# Patient Record
Sex: Female | Born: 1965 | Race: White | Hispanic: No | Marital: Married | Smoking: Never smoker
Health system: Southern US, Community
[De-identification: ages and names within clinical notes are randomized; demographics above are authoritative.]

## PROBLEM LIST (undated history)

## (undated) DIAGNOSIS — G43909 Migraine, unspecified, not intractable, without status migrainosus: Secondary | ICD-10-CM

## (undated) DIAGNOSIS — R51 Headache: Secondary | ICD-10-CM

## (undated) HISTORY — DX: Migraine, unspecified, not intractable, without status migrainosus: G43.909

## (undated) HISTORY — PX: SPINE SURGERY: SHX786

## (undated) HISTORY — PX: ESOPHAGEAL DILATION: SHX303

## (undated) HISTORY — PX: HERNIA REPAIR: SHX51

## (undated) HISTORY — PX: TUBAL LIGATION: SHX77

---

## 1998-10-14 ENCOUNTER — Other Ambulatory Visit: Admission: RE | Admit: 1998-10-14 | Discharge: 1998-10-14 | Payer: Self-pay | Admitting: Obstetrics and Gynecology

## 1998-11-11 ENCOUNTER — Ambulatory Visit (HOSPITAL_COMMUNITY): Admission: RE | Admit: 1998-11-11 | Discharge: 1998-11-11 | Payer: Self-pay | Admitting: Obstetrics and Gynecology

## 2000-03-23 ENCOUNTER — Encounter: Payer: Self-pay | Admitting: *Deleted

## 2000-03-23 ENCOUNTER — Encounter: Admission: RE | Admit: 2000-03-23 | Discharge: 2000-03-23 | Payer: Self-pay | Admitting: *Deleted

## 2000-04-11 ENCOUNTER — Encounter: Payer: Self-pay | Admitting: Family Medicine

## 2000-04-11 ENCOUNTER — Encounter: Admission: RE | Admit: 2000-04-11 | Discharge: 2000-04-11 | Payer: Self-pay | Admitting: Family Medicine

## 2000-04-13 ENCOUNTER — Ambulatory Visit (HOSPITAL_COMMUNITY): Admission: RE | Admit: 2000-04-13 | Discharge: 2000-04-13 | Payer: Self-pay | Admitting: Family Medicine

## 2001-04-27 ENCOUNTER — Encounter: Admission: RE | Admit: 2001-04-27 | Discharge: 2001-04-27 | Payer: Self-pay

## 2001-04-27 ENCOUNTER — Encounter: Payer: Self-pay | Admitting: Obstetrics and Gynecology

## 2001-08-28 ENCOUNTER — Emergency Department (HOSPITAL_COMMUNITY): Admission: EM | Admit: 2001-08-28 | Discharge: 2001-08-28 | Payer: Self-pay

## 2002-07-11 ENCOUNTER — Encounter: Admission: RE | Admit: 2002-07-11 | Discharge: 2002-07-11 | Payer: Self-pay | Admitting: Family Medicine

## 2002-07-11 ENCOUNTER — Encounter: Payer: Self-pay | Admitting: Family Medicine

## 2003-10-02 ENCOUNTER — Other Ambulatory Visit: Admission: RE | Admit: 2003-10-02 | Discharge: 2003-10-02 | Payer: Self-pay | Admitting: Obstetrics and Gynecology

## 2006-02-03 ENCOUNTER — Ambulatory Visit (HOSPITAL_COMMUNITY): Admission: RE | Admit: 2006-02-03 | Discharge: 2006-02-03 | Payer: Self-pay | Admitting: *Deleted

## 2007-07-05 ENCOUNTER — Encounter: Admission: RE | Admit: 2007-07-05 | Discharge: 2007-07-05 | Payer: Self-pay | Admitting: *Deleted

## 2007-10-26 ENCOUNTER — Emergency Department (HOSPITAL_COMMUNITY): Admission: EM | Admit: 2007-10-26 | Discharge: 2007-10-26 | Payer: Self-pay | Admitting: Emergency Medicine

## 2009-05-22 ENCOUNTER — Emergency Department (HOSPITAL_COMMUNITY): Admission: EM | Admit: 2009-05-22 | Discharge: 2009-05-22 | Payer: Self-pay | Admitting: Emergency Medicine

## 2009-06-26 ENCOUNTER — Encounter: Admission: RE | Admit: 2009-06-26 | Discharge: 2009-06-26 | Payer: Self-pay | Admitting: Obstetrics and Gynecology

## 2010-07-21 ENCOUNTER — Emergency Department (HOSPITAL_COMMUNITY)
Admission: EM | Admit: 2010-07-21 | Discharge: 2010-07-21 | Payer: Self-pay | Source: Home / Self Care | Admitting: Emergency Medicine

## 2010-08-14 ENCOUNTER — Other Ambulatory Visit: Payer: Self-pay | Admitting: Obstetrics and Gynecology

## 2010-08-14 DIAGNOSIS — Z1239 Encounter for other screening for malignant neoplasm of breast: Secondary | ICD-10-CM

## 2010-09-09 ENCOUNTER — Ambulatory Visit: Payer: Self-pay

## 2010-09-29 ENCOUNTER — Ambulatory Visit: Payer: Self-pay

## 2010-09-30 ENCOUNTER — Ambulatory Visit
Admission: RE | Admit: 2010-09-30 | Discharge: 2010-09-30 | Disposition: A | Discharge status: Home / Self Care | Payer: BC Managed Care – PPO | Source: Ambulatory Visit | Attending: Obstetrics and Gynecology | Admitting: Obstetrics and Gynecology

## 2010-09-30 DIAGNOSIS — Z1239 Encounter for other screening for malignant neoplasm of breast: Secondary | ICD-10-CM

## 2010-10-04 LAB — CBC
MCH: 30.6 pg (ref 26.0–34.0)
MCHC: 35.2 g/dL (ref 30.0–36.0)
MCV: 87.2 fL (ref 78.0–100.0)
Platelets: 277 10*3/uL (ref 150–400)
RBC: 4.83 MIL/uL (ref 3.87–5.11)
RDW: 13 % (ref 11.5–15.5)

## 2010-10-04 LAB — URINALYSIS, ROUTINE W REFLEX MICROSCOPIC
Glucose, UA: NEGATIVE mg/dL
Hgb urine dipstick: NEGATIVE
Ketones, ur: NEGATIVE mg/dL
Protein, ur: NEGATIVE mg/dL

## 2010-10-04 LAB — BASIC METABOLIC PANEL
BUN: 7 mg/dL (ref 6–23)
CO2: 25 mEq/L (ref 19–32)
Chloride: 106 mEq/L (ref 96–112)
Creatinine, Ser: 0.52 mg/dL (ref 0.4–1.2)
Glucose, Bld: 128 mg/dL — ABNORMAL HIGH (ref 70–99)

## 2010-10-04 LAB — DIFFERENTIAL
Eosinophils Absolute: 0 10*3/uL (ref 0.0–0.7)
Eosinophils Relative: 0 % (ref 0–5)
Lymphs Abs: 2.4 10*3/uL (ref 0.7–4.0)

## 2010-10-04 LAB — POCT CARDIAC MARKERS
CKMB, poc: <1 ng/mL — ABNORMAL LOW (ref 1.0–8.0)
Troponin i, poc: <0.05 ng/mL (ref 0.00–0.09)

## 2010-12-10 NOTE — Op Note (Signed)
NAME:  Kathryn Colon, Kathryn Colon NO.:  [id number]   MEDICAL RECORD NO.:  [id number]          PATIENT TYPE:  AMB   LOCATION:  SDC                           FACILITY:  [facility]   PHYSICIAN:  [place] B. Earlene Plater, M.D.  DATE OF BIRTH:  1966/04/13   DATE OF PROCEDURE:  02/03/2006  DATE OF DISCHARGE:                                 OPERATIVE REPORT   PREOPERATIVE DIAGNOSIS:  Heavy menstrual bleeding.   POSTOPERATIVE DIAGNOSIS:  Heavy menstrual bleeding.   PROCEDURE:  Hysteroscopy and NovaSure.   SURGEON:  Chester Holstein. Earlene Plater, M.D.   ASSISTANT:  None.   ANESTHESIA:  MAC and 20 mL of 1% Nesacaine paracervical block.   SPECIMENS:  None.   ESTIMATED BLOOD LOSS:  Minimal.   FLUID DEFICIT:  55 mL of normal saline.   INDICATIONS FOR PROCEDURE:  The patient with a history of heavy menstrual  bleeding not responding to medical management.  Ultrasound showed no focal  masses.  Previously status post tubal ligation, desires minimally invasive  but permanent treatment for heavy menses.  The risks of surgery were  discussed including infection, bleeding, uterine perforation, damage to  surrounding organs, and potential for procedure not to provide satisfactory  results.   DESCRIPTION OF PROCEDURE:  The patient was taken to the operating room and  MAC anesthesia obtained.  She was placed in the Sproul stirrups, prepped and  draped in the standard fashion and bladder emptied with in-and-out  catheterization.  Examination under anesthesia revealed a retroverted,  slightly enlarged uterus.  Speculum inserted and paracervical block placed.  The uterus sounded to 9 cm, retroverted.  Cervical length estimated at 4.5  cm.  Cervix dilated to #21 and a diagnostic hysteroscope was inserted after  being flushed.  Good uterine distention obtained.  Cavity inspected and no  focal abnormalities.  Scope removed.  The cervix dilated to #31 and the  NovaSure device inserted to the fundus and deployed in standard  fashion.  The cavity width was 4 cm.  The CO2 test was performed and passed.  The  NovaSure device was then activated and ablated for approximately 1 minute  and 40 seconds.  The device was removed and scope reinserted and good  endometrial coverage noted.   Instruments were removed and cervix was hemostatic.  The patient tolerated  the procedure with no complications.  She was taken to the recovery room  awake, alert, and in stable condition.      Gerri Spore B. Earlene Plater, M.D.  Electronically Signed     WBD/MEDQ  D:  02/03/2006  T:  02/03/2006  Job:  [id number]

## 2011-04-19 LAB — BASIC METABOLIC PANEL
Calcium: 9.3
Creatinine, Ser: 0.58
GFR calc Af Amer: >60

## 2011-04-19 LAB — URINALYSIS, ROUTINE W REFLEX MICROSCOPIC
Nitrite: NEGATIVE
Protein, ur: NEGATIVE
Urobilinogen, UA: 0.2

## 2011-06-29 ENCOUNTER — Encounter (HOSPITAL_COMMUNITY): Payer: Self-pay | Admitting: Emergency Medicine

## 2011-06-29 ENCOUNTER — Emergency Department (HOSPITAL_COMMUNITY)
Admission: EM | Admit: 2011-06-29 | Discharge: 2011-06-29 | Discharge status: Against Medical Advice | Payer: BC Managed Care – PPO | Attending: Emergency Medicine | Admitting: Emergency Medicine

## 2011-06-29 DIAGNOSIS — R51 Headache: Secondary | ICD-10-CM | POA: Insufficient documentation

## 2011-06-29 DIAGNOSIS — R42 Dizziness and giddiness: Secondary | ICD-10-CM | POA: Insufficient documentation

## 2011-06-29 HISTORY — DX: Headache: R51

## 2011-06-29 NOTE — ED Notes (Signed)
Pt left prior to MD screening.

## 2011-06-29 NOTE — ED Notes (Signed)
Pt has had a migraine for the past 2 days now and with dizzyness states that she has chronic mirgaines and is on meds for this and is not getting any better, no n/v alert x4 weakness, pt walked into triage and states that she has been on antibiotics for a sinus infection

## 2011-08-29 ENCOUNTER — Other Ambulatory Visit: Payer: Self-pay | Admitting: Obstetrics and Gynecology

## 2011-08-29 DIAGNOSIS — Z1231 Encounter for screening mammogram for malignant neoplasm of breast: Secondary | ICD-10-CM

## 2011-10-04 ENCOUNTER — Ambulatory Visit
Admission: RE | Admit: 2011-10-04 | Discharge: 2011-10-04 | Disposition: A | Discharge status: Home / Self Care | Payer: BC Managed Care – PPO | Source: Ambulatory Visit | Attending: Obstetrics and Gynecology | Admitting: Obstetrics and Gynecology

## 2011-10-04 ENCOUNTER — Other Ambulatory Visit: Payer: Self-pay | Admitting: Obstetrics and Gynecology

## 2011-10-04 DIAGNOSIS — N63 Unspecified lump in unspecified breast: Secondary | ICD-10-CM

## 2011-10-04 DIAGNOSIS — Z1231 Encounter for screening mammogram for malignant neoplasm of breast: Secondary | ICD-10-CM

## 2011-10-04 DIAGNOSIS — N644 Mastodynia: Secondary | ICD-10-CM

## 2011-10-11 ENCOUNTER — Other Ambulatory Visit: Payer: BC Managed Care – PPO

## 2011-10-14 ENCOUNTER — Other Ambulatory Visit: Payer: BC Managed Care – PPO

## 2011-12-14 ENCOUNTER — Ambulatory Visit (INDEPENDENT_AMBULATORY_CARE_PROVIDER_SITE_OTHER): Payer: BC Managed Care – PPO | Admitting: Family Medicine

## 2011-12-14 VITALS — BP 126/82 | HR 72 | Temp 97.7°F | Resp 16 | Ht 64.0 in | Wt 190.0 lb

## 2011-12-14 DIAGNOSIS — R197 Diarrhea, unspecified: Secondary | ICD-10-CM

## 2011-12-14 DIAGNOSIS — R5383 Other fatigue: Secondary | ICD-10-CM

## 2011-12-14 DIAGNOSIS — Z131 Encounter for screening for diabetes mellitus: Secondary | ICD-10-CM

## 2011-12-14 DIAGNOSIS — K529 Noninfective gastroenteritis and colitis, unspecified: Secondary | ICD-10-CM

## 2011-12-14 DIAGNOSIS — R5381 Other malaise: Secondary | ICD-10-CM

## 2011-12-14 DIAGNOSIS — R110 Nausea: Secondary | ICD-10-CM

## 2011-12-14 DIAGNOSIS — G43909 Migraine, unspecified, not intractable, without status migrainosus: Secondary | ICD-10-CM

## 2011-12-14 LAB — POCT SEDIMENTATION RATE: POCT SED RATE: 28 mm/h — AB (ref 0–22)

## 2011-12-14 LAB — POCT CBC
Granulocyte percent: 67.9 % (ref 37–80)
HCT, POC: 46.6 % (ref 37.7–47.9)
Hemoglobin: 15.6 g/dL (ref 12.2–16.2)
Lymph, poc: 2.7 (ref 0.6–3.4)
MCH, POC: 31 pg (ref 27–31.2)
MCHC: 33.5 g/dL (ref 31.8–35.4)
MCV: 92.5 fL (ref 80–97)
MID (cbc): 0.8 (ref 0–0.9)
MPV: 9.8 fL (ref 0–99.8)
POC Granulocyte: 7.3 — AB (ref 2–6.9)
POC LYMPH PERCENT: 24.9 % (ref 10–50)
POC MID %: 7.2 % (ref 0–12)
Platelet Count, POC: 348 10*3/uL (ref 142–424)
RBC: 5.04 M/uL (ref 4.04–5.48)
RDW, POC: 13.2 %
WBC: 10.8 10*3/uL — AB (ref 4.6–10.2)

## 2011-12-14 LAB — POCT GLYCOSYLATED HEMOGLOBIN (HGB A1C): Hemoglobin A1C: 5.3

## 2011-12-14 LAB — GLUCOSE, POCT (MANUAL RESULT ENTRY): POC Glucose: 69 mg/dL — AB (ref 70–99)

## 2011-12-14 NOTE — Progress Notes (Signed)
[facility]:  Office Visit  Chief Complaint:  Chief Complaint  Patient presents with  . Diarrhea  . Fatigue  . Migraine  . Loss of Consciousness    HPI: Kathryn Colon is a 46 y.o. female who complains of multiple medical problems. She is here mainly for her chronic diarrhea which has worsened but also has other chronic issues which have been worked up before by [employer] but due to non-payment of her bills to [facility] she has been dismissed as a patient:  1. Chronic Diarrhea x 1 year. Dx with IBS by Dr. Madilyn Fireman. Diarrhea has been more problematic. 5 episodes of nonbloody diarrhea just today, unable to hold anything down. She has had a full work up by GI supposedly without a colonoscopy  and has been dx IBS. Dr. Madilyn Fireman was her GI doctor. She never had a colonoscopy. Has had EGD. Family hx of maternal aunt with colon cancer dx ager 43.  Denies h/o IBS/IBD/crohns/UC.  2. Chronic Migraines-seizure prone migraines. Still sees Dr. Neale Burly at [facility] wellness program. Tried Topomax, amitryptiline, Imitrex; currently Zomeg.  3. Weakness,lethargy and slurred speech intermittently x 1 year. She feels like she has dizzy spells. Doesn't get better until she lays down and goes to sleep.Has had a cardiology workup for syncopal episode with [facility]. Has had syncopal episode 2 episodes last week and then 1 epsiode this week. She states that it is once every 3 weeks/4 weeks.+ Numbness in legs and hands. No asymmetrical weakness. Again all of this has been chronic. Has had echo, 2 MRIs, has had carotid doppler , has had EEG which were all normal.   Past Medical History  Diagnosis Date  . Headache   . Asthma   . Migraines    Past Surgical History  Procedure Date  . Cesarean section   . Hernia repair   . Spine surgery   . Tubal ligation   . Esophageal dilation    History   Social History  . Marital Status: Married    Spouse Name: N/A    Number of Children: N/A  .  Years of Education: N/A   Social History Main Topics  . Smoking status: Never Smoker   . Smokeless tobacco: Not on file  . Alcohol Use: No  . Drug Use: No  . Sexually Active: Not on file   Other Topics Concern  . Not on file   Social History Narrative  . No narrative on file   Family History  Problem Relation Age of Onset  . Diabetes Mother   . Heart disease Mother   . Hyperlipidemia Mother   . Hypertension Mother   . Diabetes Father   . Cancer Father   . Stroke Father   . Hypertension Father    Allergies  Allergen Reactions  . Topamax (Topiramate) Other (See Comments)    Cant function  . Penicillins Rash   Prior to Admission medications   Medication Sig Start Date End Date Taking? Authorizing Provider  Multiple Vitamin (MULTIVITAMIN) capsule Take 1 capsule by mouth daily.   Yes Historical Provider, MD  perphenazine (TRILAFON) 4 MG tablet Take 4 mg by mouth as needed.   Yes Historical Provider, MD  zonisamide (ZONEGRAN) 25 MG capsule Take 25 mg by mouth 4 (four) times daily.    Yes Historical Provider, MD  Fluticasone-Salmeterol (ADVAIR) 250-50 MCG/DOSE AEPB Inhale 1 puff into the lungs every 12 (twelve) hours.      Historical Provider, MD  lansoprazole (PREVACID SOLUTAB) 30 MG disintegrating tablet Take 30 mg by mouth daily.      Historical Provider, MD  meclizine (ANTIVERT) 25 MG tablet Take 12.5 mg by mouth 3 (three) times daily as needed.      Historical Provider, MD  naproxen sodium (ANAPROX) 220 MG tablet Take 220 mg by mouth 2 (two) times daily with a meal.      Historical Provider, MD     ROS: The patient denies fevers, chills, night sweats, unintentional weight loss, chest pain, palpitations, wheezing, dyspnea on exertion, nausea, vomiting, dysuria, hematuria, melena, +numbness, or tingling. + diarrhea, weakness, abd cramps  All other systems have been reviewed and were otherwise negative with the exception of those mentioned in the HPI and as above.     PHYSICAL EXAM: Filed Vitals:   12/14/11 1823  BP: 126/82  Pulse: 72  Temp: 97.7 F (36.5 C)  Resp: 16   Filed Vitals:   12/14/11 1823  Height: 5\' 4"  (1.626 m)  Weight: 190 lb (86.183 kg)   Body mass index is 32.61 kg/(m^2).  General: Alert, no acute distress HEENT:  Normocephalic, atraumatic, oropharynx patent. EOMI, PERRLA, TM nl Cardiovascular:  Regular rate and rhythm, no rubs murmurs or gallops.  No Carotid bruits, radial pulse intact. No pedal edema.  Respiratory: Clear to auscultation bilaterally.  No wheezes, rales, or rhonchi.  No cyanosis, no use of accessory musculature GI: No organomegaly, abdomen is soft and non-tender, positive bowel sounds.  No masses. Skin: + epidermolysis bullosa rashes. Neurologic: Facial musculature symmetric. CN 2-12 grossly intact Psychiatric: Patient is appropriate throughout our interaction. Lymphatic: No cervical lymphadenopathy Musculoskeletal: Gait intact.   LABS: Results for orders placed in visit on 12/14/11  POCT CBC      Component Value Range   WBC 10.8 (*) 4.6 - 10.2 (K/uL)   Lymph, poc 2.7  0.6 - 3.4    POC LYMPH PERCENT 24.9  10 - 50 (%L)   MID (cbc) 0.8  0 - 0.9    POC MID % 7.2  0 - 12 (%M)   POC Granulocyte 7.3 (*) 2 - 6.9    Granulocyte percent 67.9  37 - 80 (%G)   RBC 5.04  4.04 - 5.48 (M/uL)   Hemoglobin 15.6  12.2 - 16.2 (g/dL)   HCT, POC 04.5  40.9 - 47.9 (%)   MCV 92.5  80 - 97 (fL)   MCH, POC 31.0  27 - 31.2 (pg)   MCHC 33.5  31.8 - 35.4 (g/dL)   RDW, POC 81.1     Platelet Count, POC 348  142 - 424 (K/uL)   MPV 9.8  0 - 99.8 (fL)  POCT GLYCOSYLATED HEMOGLOBIN (HGB A1C)      Component Value Range   Hemoglobin A1C 5.3    GLUCOSE, POCT (MANUAL RESULT ENTRY)      Component Value Range   POC Glucose 69 (*) 70 - 99 (mg/dl)     EKG/XRAY:   Primary read interpreted by Dr. Conley Rolls at [facility].   ASSESSMENT/PLAN: Encounter Diagnoses  Name Primary?  . Chronic diarrhea Yes  . Migraine   . Fatigue   . Nausea    . Screening for diabetes mellitus     1. Chronic Diarrhea with dx of IBS-Needs GI referral for eval/treatment and also colonoscopy Preferred times are in late afternoon anything after 3 pm . NO MONDAYS, prefer  late in the week.  2. Will get results/notes from prior workup before further more  costly repeat labs. Pending labs include CMP, TSH. If labs normal consider trial of Bentyl for IBS and diarrhea while trying to get in to see GI 3. Hypoglycemia due to diarrhea and not eating due to fear of diarrhea. She has sxs associated with hypoglycemia. Recommend eating small bland meals, also keep glucose tablets/hard candy on hand if start feeling dizzy, light headed, nauseated, tired.   F/u with Dr. Audria Nine in 2-4 weeks after GI appt. Go to ER prn for CP/Syncope/SOB/TIA/CVA sxs   Briza Bark PHUONG, DO 12/14/2011 7:28 PM

## 2011-12-15 LAB — COMPREHENSIVE METABOLIC PANEL WITH GFR
ALT: 16 U/L (ref 0–35)
AST: 18 U/L (ref 0–37)
Albumin: 4.4 g/dL (ref 3.5–5.2)
Alkaline Phosphatase: 53 U/L (ref 39–117)
BUN: 6 mg/dL (ref 6–23)
Potassium: 4 meq/L (ref 3.5–5.3)

## 2011-12-15 LAB — COMPREHENSIVE METABOLIC PANEL
CO2: 24 mEq/L (ref 19–32)
Calcium: 9.5 mg/dL (ref 8.4–10.5)
Chloride: 106 mEq/L (ref 96–112)
Creat: 0.54 mg/dL (ref 0.50–1.10)
Glucose, Bld: 82 mg/dL (ref 70–99)
Sodium: 140 mEq/L (ref 135–145)
Total Bilirubin: 0.5 mg/dL (ref 0.3–1.2)
Total Protein: 7.4 g/dL (ref 6.0–8.3)

## 2011-12-15 LAB — TSH: TSH: 3.928 u[IU]/mL (ref 0.350–4.500)

## 2011-12-16 ENCOUNTER — Telehealth: Payer: Self-pay

## 2011-12-16 DIAGNOSIS — K529 Noninfective gastroenteritis and colitis, unspecified: Secondary | ICD-10-CM

## 2011-12-16 NOTE — Telephone Encounter (Signed)
The patient was instructed by Dr. Conley Rolls to call today to inquire about her lab results from earlier this week. Please contact patient and inform her of results.

## 2011-12-18 ENCOUNTER — Other Ambulatory Visit: Payer: Self-pay | Admitting: Family Medicine

## 2011-12-18 ENCOUNTER — Telehealth: Payer: Self-pay | Admitting: Family Medicine

## 2011-12-18 DIAGNOSIS — K589 Irritable bowel syndrome without diarrhea: Secondary | ICD-10-CM

## 2011-12-18 MED ORDER — HYOSCYAMINE SULFATE 0.125 MG SL SUBL: 0.1250 mg | SUBLINGUAL_TABLET | SUBLINGUAL | Status: DC | PRN

## 2011-12-18 NOTE — Telephone Encounter (Signed)
Call Dr Irwin Brakeman patient.  Her labs were ok except for a high normal sed rate which shows very mild inflammation.  Since her diarrhea has worsened over the last year, I think she should see a gastroenterologist to check for Crohn's Dz.  Does she have a gastroenterologist?  If not, please initiate referral got [facility].

## 2011-12-18 NOTE — Telephone Encounter (Signed)
LM about lab results. Will start her on Hyocyamine/Levsin for IBS sxs with diarrhea. She has been referred to GI. F/u prn with Dr. Dow Adolph to establish care after GI appt.

## 2011-12-18 NOTE — Telephone Encounter (Signed)
Spoke with patient regarding labs. Told her we will try Levsin instead of Bentyl. Gave her Hung's office # so she can call to see if they got referral. F/u prn

## 2011-12-19 NOTE — Telephone Encounter (Signed)
LMOM to CB. 

## 2011-12-21 NOTE — Telephone Encounter (Signed)
LMOM to CB. 

## 2011-12-21 NOTE — Telephone Encounter (Signed)
Gave pt results and she agreed to referral to a GI spec. Dr Conley Rolls had mentioned Dr Elnoria Howard to her, so she would prefer to try there first

## 2012-11-23 ENCOUNTER — Ambulatory Visit: Payer: BC Managed Care – PPO | Admitting: Internal Medicine

## 2012-12-13 ENCOUNTER — Ambulatory Visit: Payer: BC Managed Care – PPO | Admitting: Internal Medicine

## 2013-02-12 ENCOUNTER — Ambulatory Visit: Payer: BC Managed Care – PPO | Admitting: Internal Medicine

## 2013-03-12 ENCOUNTER — Telehealth: Payer: Self-pay

## 2013-03-12 NOTE — Telephone Encounter (Signed)
Linda from [place] called concerning records that they transferred to [place] for pt. She requests that someone from our [facility] call her at [phone].

## 2013-03-13 NOTE — Telephone Encounter (Signed)
Spoke to [place] from [place]. She just wanted to know if we received patients records from over several years ago- Yes MR [id number]. Patient was last seen in Epic in 2013. She asked if we could give patient a copy of those records. Patient still needs to sign a release of information. Patient has not signed one or called our office yet. I told Kathryn Colon her office could easily give those records also, she just needs to follow the procedure for medical records at their office.

## 2015-10-16 ENCOUNTER — Encounter: Payer: Self-pay | Admitting: Internal Medicine

## 2015-11-04 ENCOUNTER — Emergency Department (HOSPITAL_COMMUNITY)
Admission: EM | Admit: 2015-11-04 | Discharge: 2015-11-04 | Disposition: A | Discharge status: Home / Self Care | Payer: Medicare Other | Attending: Emergency Medicine | Admitting: Emergency Medicine

## 2015-11-04 ENCOUNTER — Encounter (HOSPITAL_COMMUNITY): Payer: Self-pay | Admitting: Emergency Medicine

## 2015-11-04 ENCOUNTER — Emergency Department (HOSPITAL_COMMUNITY): Payer: Medicare Other

## 2015-11-04 DIAGNOSIS — Z79899 Other long term (current) drug therapy: Secondary | ICD-10-CM | POA: Diagnosis not present

## 2015-11-04 DIAGNOSIS — J40 Bronchitis, not specified as acute or chronic: Secondary | ICD-10-CM

## 2015-11-04 DIAGNOSIS — R079 Chest pain, unspecified: Secondary | ICD-10-CM | POA: Diagnosis present

## 2015-11-04 DIAGNOSIS — Z792 Long term (current) use of antibiotics: Secondary | ICD-10-CM | POA: Insufficient documentation

## 2015-11-04 DIAGNOSIS — J45901 Unspecified asthma with (acute) exacerbation: Secondary | ICD-10-CM | POA: Insufficient documentation

## 2015-11-04 DIAGNOSIS — Z91040 Latex allergy status: Secondary | ICD-10-CM | POA: Diagnosis not present

## 2015-11-04 DIAGNOSIS — R0789 Other chest pain: Secondary | ICD-10-CM

## 2015-11-04 DIAGNOSIS — Z7951 Long term (current) use of inhaled steroids: Secondary | ICD-10-CM | POA: Insufficient documentation

## 2015-11-04 DIAGNOSIS — R112 Nausea with vomiting, unspecified: Secondary | ICD-10-CM | POA: Insufficient documentation

## 2015-11-04 DIAGNOSIS — Z8679 Personal history of other diseases of the circulatory system: Secondary | ICD-10-CM | POA: Diagnosis not present

## 2015-11-04 DIAGNOSIS — Z880 Allergy status to penicillin: Secondary | ICD-10-CM | POA: Diagnosis not present

## 2015-11-04 DIAGNOSIS — R197 Diarrhea, unspecified: Secondary | ICD-10-CM | POA: Diagnosis not present

## 2015-11-04 LAB — BASIC METABOLIC PANEL
Anion gap: 13 (ref 5–15)
BUN: 8 mg/dL (ref 6–20)
CALCIUM: 10 mg/dL (ref 8.9–10.3)
CO2: 23 mmol/L (ref 22–32)
CREATININE: 0.72 mg/dL (ref 0.44–1.00)
Chloride: 100 mmol/L — ABNORMAL LOW (ref 101–111)
GFR calc Af Amer: >60 mL/min (ref >60)
GLUCOSE: 126 mg/dL — AB (ref 65–99)
Potassium: 4.1 mmol/L (ref 3.5–5.1)
Sodium: 136 mmol/L (ref 135–145)

## 2015-11-04 LAB — CBC
HCT: 43.7 % (ref 36.0–46.0)
HEMOGLOBIN: 14.7 g/dL (ref 12.0–15.0)
MCH: 29.7 pg (ref 26.0–34.0)
MCHC: 33.6 g/dL (ref 30.0–36.0)
MCV: 88.3 fL (ref 78.0–100.0)
Platelets: 308 10*3/uL (ref 150–400)
RBC: 4.95 MIL/uL (ref 3.87–5.11)
RDW: 13.6 % (ref 11.5–15.5)
WBC: 13.2 10*3/uL — ABNORMAL HIGH (ref 4.0–10.5)

## 2015-11-04 LAB — I-STAT TROPONIN, ED: TROPONIN I, POC: 0 ng/mL (ref 0.00–0.08)

## 2015-11-04 MED ORDER — IPRATROPIUM-ALBUTEROL 0.5-2.5 (3) MG/3ML IN SOLN
3.0000 mL | Freq: Once | RESPIRATORY_TRACT | Status: AC
  Administered 2015-11-04: 3 mL via RESPIRATORY_TRACT
  Filled 2015-11-04: qty 3

## 2015-11-04 MED ORDER — NAPROXEN 500 MG PO TABS: 500.0000 mg | ORAL_TABLET | Freq: Two times a day (BID) | ORAL | Status: DC

## 2015-11-04 MED ORDER — PREDNISONE 20 MG PO TABS
60.0000 mg | ORAL_TABLET | Freq: Once | ORAL | Status: AC
  Administered 2015-11-04: 60 mg via ORAL
  Filled 2015-11-04: qty 3

## 2015-11-04 MED ORDER — KETOROLAC TROMETHAMINE 30 MG/ML IJ SOLN
30.0000 mg | Freq: Once | INTRAMUSCULAR | Status: AC
  Administered 2015-11-04: 30 mg via INTRAVENOUS
  Filled 2015-11-04: qty 1

## 2015-11-04 NOTE — Discharge Instructions (Signed)
You were seen today for chest pain. Your chest pain is reproducible on exam which is suggestive of musculoskeletal pain. You also have wheezing and this is likely related to recent bronchitis. You are encouraged to finish her course of steroids.  Chest Wall Pain Chest wall pain is pain in or around the bones and muscles of your chest. Sometimes, an injury causes this pain. Sometimes, the cause may not be known. This pain may take several weeks or longer to get better. HOME CARE INSTRUCTIONS  Pay attention to any changes in your symptoms. Take these actions to help with your pain:   Rest as told by your health care provider.   Avoid activities that cause pain. These include any activities that use your chest muscles or your abdominal and side muscles to lift heavy items.   If directed, apply ice to the painful area:  Put ice in a plastic bag.  Place a towel between your skin and the bag.  Leave the ice on for 20 minutes, 2-3 times per day.  Take over-the-counter and prescription medicines only as told by your health care provider.  Do not use tobacco products, including cigarettes, chewing tobacco, and e-cigarettes. If you need help quitting, ask your health care provider.  Keep all follow-up visits as told by your health care provider. This is important. SEEK MEDICAL CARE IF:  You have a fever.  Your chest pain becomes worse.  You have new symptoms. SEEK IMMEDIATE MEDICAL CARE IF:  You have nausea or vomiting.  You feel sweaty or light-headed.  You have a cough with phlegm (sputum) or you cough up blood.  You develop shortness of breath.   This information is not intended to replace advice given to you by your health care provider. Make sure you discuss any questions you have with your health care provider.   Document Released: 07/11/2005 Document Revised: 04/01/2015 Document Reviewed: 10/06/2014 Elsevier Interactive Patient Education 2016 [employer] Acute  Bronchitis Bronchitis is inflammation of the airways that extend from the windpipe into the lungs (bronchi). The inflammation often causes mucus to develop. This leads to a cough, which is the most common symptom of bronchitis.  In acute bronchitis, the condition usually develops suddenly and goes away over time, usually in a couple weeks. Smoking, allergies, and asthma can make bronchitis worse. Repeated episodes of bronchitis may cause further lung problems.  CAUSES Acute bronchitis is most often caused by the same virus that causes a cold. The virus can spread from person to person (contagious) through coughing, sneezing, and touching contaminated objects. SIGNS AND SYMPTOMS   Cough.   Fever.   Coughing up mucus.   Body aches.   Chest congestion.   Chills.   Shortness of breath.   Sore throat.  DIAGNOSIS  Acute bronchitis is usually diagnosed through a physical exam. Your health care provider will also ask you questions about your medical history. Tests, such as chest X-rays, are sometimes done to rule out other conditions.  TREATMENT  Acute bronchitis usually goes away in a couple weeks. Oftentimes, no medical treatment is necessary. Medicines are sometimes given for relief of fever or cough. Antibiotic medicines are usually not needed but may be prescribed in certain situations. In some cases, an inhaler may be recommended to help reduce shortness of breath and control the cough. A cool mist vaporizer may also be used to help thin bronchial secretions and make it easier to clear the chest.  HOME CARE INSTRUCTIONS  Get plenty  of rest.   Drink enough fluids to keep your urine clear or pale yellow (unless you have a medical condition that requires fluid restriction). Increasing fluids may help thin your respiratory secretions (sputum) and reduce chest congestion, and it will prevent dehydration.   Take medicines only as directed by your health care provider.  If you were  prescribed an antibiotic medicine, finish it all even if you start to feel better.  Avoid smoking and secondhand smoke. Exposure to cigarette smoke or irritating chemicals will make bronchitis worse. If you are a smoker, consider using nicotine gum or skin patches to help control withdrawal symptoms. Quitting smoking will help your lungs heal faster.   Reduce the chances of another bout of acute bronchitis by washing your hands frequently, avoiding people with cold symptoms, and trying not to touch your hands to your mouth, nose, or eyes.   Keep all follow-up visits as directed by your health care provider.  SEEK MEDICAL CARE IF: Your symptoms do not improve after 1 week of treatment.  SEEK IMMEDIATE MEDICAL CARE IF:  You develop an increased fever or chills.   You have chest pain.   You have severe shortness of breath.  You have bloody sputum.   You develop dehydration.  You faint or repeatedly feel like you are going to pass out.  You develop repeated vomiting.  You develop a severe headache. MAKE SURE YOU:   Understand these instructions.  Will watch your condition.  Will get help right away if you are not doing well or get worse.   This information is not intended to replace advice given to you by your health care provider. Make sure you discuss any questions you have with your health care provider.   Document Released: 08/18/2004 Document Revised: 08/01/2014 Document Reviewed: 01/01/2013 Elsevier Interactive Patient Education [employer]

## 2015-11-04 NOTE — ED Provider Notes (Signed)
CSN: [id number]     Arrival date & time 11/04/15  0138 History   By signing my name below, I, Kathryn Colon, attest that this documentation has been prepared under the direction and in the presence of Kathryn Batonourtney F Marco Adelson, MD.  Electronically Signed: Arlan OrganAshley Colon, ED Scribe. 11/04/2015. 6:41 AM.   Chief Complaint  Patient presents with  . Chest Pain   The history is provided by the patient. No language interpreter was used.    HPI Comments: Kathryn Colon is a 50 y.o. female without any pertinent past medical history who presents to the Emergency Department complaining of constant, ongoing chest pain; R greater than L that radiates to the bilateral jaw onset 8p. Pt also reports HA, shortness of breath, nausea, vomiting, and diarrhea. No aggravating or alleviating factors reported. Pt felt symptoms may have been related to acid reflux as she recalls similar symptoms in the past. No history of hypertension, hyperlipidemia, diabetes or smoking. Patient does report recent history of bronchitis and is currently on doxycycline and a Medrol Dosepak. OTC Tums attempted at home without any improvement. No recent fever, chills, or abdominal pain. No recent leg travel. No recent long distance travel. She denies any prior history of blood clots.  PCP: No primary care provider on file.    Past Medical History  Diagnosis Date  . Headache(784.0)   . Asthma   . Migraines    Past Surgical History  Procedure Laterality Date  . Cesarean section    . Hernia repair    . Spine surgery    . Tubal ligation    . Esophageal dilation     Family History  Problem Relation Age of Onset  . Diabetes Mother   . Heart disease Mother   . Hyperlipidemia Mother   . Hypertension Mother   . Diabetes Father   . Cancer Father   . Stroke Father   . Hypertension Father    Social History  Substance Use Topics  . Smoking status: Never Smoker   . Smokeless tobacco: None  . Alcohol Use: No   OB History    No data  available     Review of Systems  Constitutional: Negative for fever and chills.  Respiratory: Positive for shortness of breath.   Cardiovascular: Positive for chest pain.  Gastrointestinal: Positive for nausea, vomiting and diarrhea.  Musculoskeletal: Positive for arthralgias.  Neurological: Negative for light-headedness.  All other systems reviewed and are negative.     Allergies  Topamax; Latex; and Penicillins  Home Medications   Prior to Admission medications   Medication Sig Start Date End Date Taking? Authorizing Provider  albuterol (PROVENTIL HFA;VENTOLIN HFA) 108 (90 Base) MCG/ACT inhaler Inhale 2 puffs into the lungs every 6 (six) hours as needed for wheezing or shortness of breath.   Yes Historical Provider, MD  doxycycline (VIBRAMYCIN) 100 MG capsule Take 100 mg by mouth 2 (two) times daily.   Yes Historical Provider, MD  mometasone-formoterol (DULERA) 200-5 MCG/ACT AERO Inhale 2 puffs into the lungs 2 (two) times daily.   Yes Historical Provider, MD  hyoscyamine (LEVSIN SL) 0.125 MG SL tablet Place 1 tablet (0.125 mg total) under the tongue every 4 (four) hours as needed for cramping. Patient not taking: Reported on 11/04/2015 12/18/11 11/04/15  Thao P Le, DO  naproxen (NAPROSYN) 500 MG tablet Take 1 tablet (500 mg total) by mouth 2 (two) times daily. 11/04/15   Kathryn Batonourtney F Rande Roylance, MD   Triage Vitals: BP 107/86 mmHg  Pulse 66  Temp(Src) 97.8 F (36.6 C) (Oral)  Resp 13  Ht  (1.626 m)  Wt 182 lb (82.555 kg)  BMI 31.22 kg/m2  SpO2 98%   Physical Exam  Constitutional: She is oriented to person, place, and time. She appears well-developed and well-nourished. No distress.  HENT:  Head: Normocephalic and atraumatic.  Cardiovascular: Normal rate, regular rhythm and normal heart sounds.   No murmur heard. Pulmonary/Chest: Effort normal. No respiratory distress. She has wheezes. She exhibits tenderness.  Abdominal: Soft. Bowel sounds are normal. There is no  tenderness. There is no rebound.  Musculoskeletal: She exhibits no edema.  Neurological: She is alert and oriented to person, place, and time.  Skin: Skin is warm and dry.  Psychiatric: She has a normal mood and affect.  Nursing note and vitals reviewed.   ED Course  Procedures (including critical care time)  DIAGNOSTIC STUDIES: Oxygen Saturation is 99% on RA, Normal by my interpretation.    COORDINATION OF CARE: 3:31 AM- Will order CXR, blood work, and EKG. Discussed treatment plan with pt at bedside and pt agreed to plan.     Labs Review Labs Reviewed  BASIC METABOLIC PANEL - Abnormal; Notable for the following:    Chloride 100 (*)    Glucose, Bld 126 (*)    All other components within normal limits  CBC - Abnormal; Notable for the following:    WBC 13.2 (*)    All other components within normal limits  I-STAT TROPOININ, ED    Imaging Review Dg Chest 2 View  11/04/2015  CLINICAL DATA:  Acute onset of shortness of breath, cough and wheezing. Initial encounter. EXAM: CHEST  2 VIEW COMPARISON:  None. FINDINGS: The lungs are well-aerated and clear. There is no evidence of focal opacification, pleural effusion or pneumothorax. The heart is normal in size; the mediastinal contour is within normal limits. No acute osseous abnormalities are seen. A small hiatal hernia is seen. IMPRESSION: 1. No acute cardiopulmonary process seen. 2. Small hiatal hernia noted. Electronically Signed   By: Roanna Raider M.D.   On: 11/04/2015 02:52   I have personally reviewed and evaluated these images and lab results as part of my medical decision-making.   EKG Interpretation   Date/Time:  Wednesday November 04 2015 01:49:03 EDT Ventricular Rate:  84 PR Interval:  126 QRS Duration: 84 QT Interval:  370 QTC Calculation: 437 R Axis:   37 Text Interpretation:  Normal sinus rhythm Possible Anterior infarct , age  undetermined Abnormal ECG No significant change since last tracing  Confirmed by Capria Cartaya   MD, Sanam Marmo ([id number]) on 11/04/2015 3:10:29 AM      MDM   Final diagnoses:  Musculoskeletal chest pain  Bronchitis    Patient presents with chest pain. Onset 8 PM. Time of my evaluation was approximately 3 AM. Pain is atypical. She does have reproducible tenderness on exam. She is low risk for ACS. EKG is unchanged from prior without ischemic changes. Chest x-ray is reassuring. She does have wheezing on exam. Patient was given a DuoNeb and Toradol for pain. She has no lower externally swelling and is low risk for PE. Given wheezing and recent history of bronchitis as well as reproducible nature pain on exam, suspect chest wall pain. Patient reports improvement of symptoms on recheck. I have encouraged patient to continue Medrol Dosepak at home as well as inhalers. Naproxen as needed for pain.  After history, exam, and medical workup I feel the patient has been  appropriately medically screened and is safe for discharge home. Pertinent diagnoses were discussed with the patient. Patient was given return precautions.  I personally performed the services described in this documentation, which was scribed in my presence. The recorded information has been reviewed and is accurate.   Kathryn Baton, MD 11/04/15 [phone]

## 2015-11-04 NOTE — ED Notes (Signed)
Pt. reports central chest pain radiating to bilateral jaw , neck and upper back pain with SOB , nausea/emesis onset last night .

## 2018-07-09 ENCOUNTER — Other Ambulatory Visit: Payer: Self-pay | Admitting: Obstetrics and Gynecology

## 2018-07-09 DIAGNOSIS — N6310 Unspecified lump in the right breast, unspecified quadrant: Secondary | ICD-10-CM

## 2018-07-19 ENCOUNTER — Ambulatory Visit
Admission: RE | Admit: 2018-07-19 | Discharge: 2018-07-19 | Disposition: A | Discharge status: Home / Self Care | Payer: Medicare Other | Source: Ambulatory Visit | Attending: Obstetrics and Gynecology | Admitting: Obstetrics and Gynecology

## 2018-07-19 ENCOUNTER — Other Ambulatory Visit: Payer: Self-pay | Admitting: Obstetrics and Gynecology

## 2018-07-19 DIAGNOSIS — R921 Mammographic calcification found on diagnostic imaging of breast: Secondary | ICD-10-CM

## 2018-07-19 DIAGNOSIS — N6310 Unspecified lump in the right breast, unspecified quadrant: Secondary | ICD-10-CM

## 2018-07-26 ENCOUNTER — Ambulatory Visit
Admission: RE | Admit: 2018-07-26 | Discharge: 2018-07-26 | Disposition: A | Discharge status: Home / Self Care | Payer: Medicare Other | Source: Ambulatory Visit | Attending: Obstetrics and Gynecology | Admitting: Obstetrics and Gynecology

## 2018-07-26 ENCOUNTER — Other Ambulatory Visit: Payer: Self-pay | Admitting: Obstetrics and Gynecology

## 2018-07-26 DIAGNOSIS — R921 Mammographic calcification found on diagnostic imaging of breast: Secondary | ICD-10-CM

## 2018-09-20 ENCOUNTER — Emergency Department (HOSPITAL_COMMUNITY)
Admission: EM | Admit: 2018-09-20 | Discharge: 2018-09-20 | Payer: Medicare Other | Attending: Emergency Medicine | Admitting: Emergency Medicine

## 2018-09-20 ENCOUNTER — Other Ambulatory Visit: Payer: Self-pay

## 2018-09-20 ENCOUNTER — Emergency Department (HOSPITAL_COMMUNITY): Payer: Medicare Other

## 2018-09-20 ENCOUNTER — Encounter (HOSPITAL_COMMUNITY): Payer: Self-pay | Admitting: *Deleted

## 2018-09-20 DIAGNOSIS — Z91040 Latex allergy status: Secondary | ICD-10-CM | POA: Insufficient documentation

## 2018-09-20 DIAGNOSIS — R198 Other specified symptoms and signs involving the digestive system and abdomen: Secondary | ICD-10-CM | POA: Insufficient documentation

## 2018-09-20 DIAGNOSIS — R079 Chest pain, unspecified: Secondary | ICD-10-CM | POA: Diagnosis present

## 2018-09-20 DIAGNOSIS — Z79899 Other long term (current) drug therapy: Secondary | ICD-10-CM | POA: Insufficient documentation

## 2018-09-20 DIAGNOSIS — J45909 Unspecified asthma, uncomplicated: Secondary | ICD-10-CM | POA: Diagnosis not present

## 2018-09-20 LAB — CBC WITH DIFFERENTIAL/PLATELET
ABS IMMATURE GRANULOCYTES: 0.05 10*3/uL (ref 0.00–0.07)
BASOS PCT: 0 %
Basophils Absolute: 0 10*3/uL (ref 0.0–0.1)
EOS ABS: 0.1 10*3/uL (ref 0.0–0.5)
Eosinophils Relative: 1 %
HCT: 40.9 % (ref 36.0–46.0)
Hemoglobin: 13.2 g/dL (ref 12.0–15.0)
IMMATURE GRANULOCYTES: 1 %
Lymphocytes Relative: 20 %
Lymphs Abs: 1.8 10*3/uL (ref 0.7–4.0)
MCH: 28.2 pg (ref 26.0–34.0)
MCHC: 32.3 g/dL (ref 30.0–36.0)
MCV: 87.4 fL (ref 80.0–100.0)
Monocytes Absolute: 0.9 10*3/uL (ref 0.1–1.0)
Monocytes Relative: 10 %
NEUTROS ABS: 6.2 10*3/uL (ref 1.7–7.7)
NEUTROS PCT: 68 %
Platelets: 268 10*3/uL (ref 150–400)
RBC: 4.68 MIL/uL (ref 3.87–5.11)
RDW: 13.5 % (ref 11.5–15.5)
WBC: 9.1 10*3/uL (ref 4.0–10.5)
nRBC: 0 % (ref 0.0–0.2)

## 2018-09-20 LAB — COMPREHENSIVE METABOLIC PANEL
ALK PHOS: 53 U/L (ref 38–126)
ALT: 49 U/L — ABNORMAL HIGH (ref 0–44)
ANION GAP: 10 (ref 5–15)
AST: 40 U/L (ref 15–41)
Albumin: 3.5 g/dL (ref 3.5–5.0)
BILIRUBIN TOTAL: 0.5 mg/dL (ref 0.3–1.2)
BUN: 6 mg/dL (ref 6–20)
CALCIUM: 9.4 mg/dL (ref 8.9–10.3)
CO2: 23 mmol/L (ref 22–32)
Chloride: 105 mmol/L (ref 98–111)
Creatinine, Ser: 0.67 mg/dL (ref 0.44–1.00)
Glucose, Bld: 106 mg/dL — ABNORMAL HIGH (ref 70–99)
POTASSIUM: 3.1 mmol/L — AB (ref 3.5–5.1)
Sodium: 138 mmol/L (ref 135–145)
TOTAL PROTEIN: 7.1 g/dL (ref 6.5–8.1)

## 2018-09-20 LAB — URINALYSIS, ROUTINE W REFLEX MICROSCOPIC
Bilirubin Urine: NEGATIVE
Glucose, UA: NEGATIVE mg/dL
Hgb urine dipstick: NEGATIVE
Ketones, ur: 5 mg/dL — AB
Nitrite: NEGATIVE
Protein, ur: NEGATIVE mg/dL
Specific Gravity, Urine: 1.006 (ref 1.005–1.030)
pH: 9 — ABNORMAL HIGH (ref 5.0–8.0)

## 2018-09-20 LAB — LIPASE, BLOOD: LIPASE: 26 U/L (ref 11–51)

## 2018-09-20 LAB — TROPONIN I: Troponin I: <0.03 ng/mL (ref <0.03)

## 2018-09-20 MED ORDER — METRONIDAZOLE IN NACL 5-0.79 MG/ML-% IV SOLN
500.0000 mg | Freq: Once | INTRAVENOUS | Status: AC
  Administered 2018-09-20: 500 mg via INTRAVENOUS
  Filled 2018-09-20: qty 100

## 2018-09-20 MED ORDER — POTASSIUM CHLORIDE 10 MEQ/100ML IV SOLN
10.0000 meq | Freq: Once | INTRAVENOUS | Status: AC
  Administered 2018-09-20: 10 meq via INTRAVENOUS
  Filled 2018-09-20: qty 100

## 2018-09-20 MED ORDER — SODIUM CHLORIDE 0.9 % IV SOLN
2.0000 g | Freq: Once | INTRAVENOUS | Status: AC
  Administered 2018-09-20: 2 g via INTRAVENOUS
  Filled 2018-09-20: qty 2

## 2018-09-20 MED ORDER — SODIUM CHLORIDE 0.9 % IV SOLN
1.0000 g | Freq: Once | INTRAVENOUS | Status: DC
  Filled 2018-09-20: qty 1

## 2018-09-20 MED ORDER — IOPAMIDOL (ISOVUE-370) INJECTION 76%
INTRAVENOUS | Status: AC
  Administered 2018-09-20: 100 mL
  Filled 2018-09-20: qty 100

## 2018-09-20 MED ORDER — FENTANYL CITRATE (PF) 100 MCG/2ML IJ SOLN
50.0000 ug | Freq: Once | INTRAMUSCULAR | Status: AC
  Administered 2018-09-20: 50 ug via INTRAVENOUS
  Filled 2018-09-20: qty 2

## 2018-09-20 NOTE — ED Triage Notes (Signed)
The pt arrived by [facility] from home  She had surgery abd at [facility] 2 days ago and was senr home home from there yesterday.  She had her gb removed and a hernia procedure.  approx 45 minutes ago she had pain in her mid chest and the pain radiates into both shoulders and into the back of her neck  Hx anxiety

## 2018-09-20 NOTE — ED Provider Notes (Signed)
[facility] Provider Note   CSN: [id number] Arrival date & time: 09/20/18  0050    History   Chief Complaint Chief Complaint  Patient presents with  . Chest Pain    HPI Kathryn Colon is a 53 y.o. female.     Patient presents with shortness of breath progressively worsening since surgery yesterday.  States she does have a history of asthma.  [facility] Dr. Lily PeerFernandez.  laparoscopic hiatal hernia repair and toupet fundoaplication, possible mesh and gastropexy and laparoscopic cholecystectomy.  She reports ever since then she is had progressively worsening shortness of breath.  Tonight she developed sharp pain in her left side of her chest that radiates to her arm and her neck.  This lasted for about 30 seconds.  This happened twice before EMS was called.  She is not have any chest pain currently.  She still continues to feel short of breath.  Denies any swelling.  States her abdominal pain is unchanged since her surgery denies any change with this.  No pain with urination or blood in the urine.  She is not had this kind of chest pain in the past.  The history is provided by the patient.    Past Medical History:  Diagnosis Date  . Asthma   . Headache(784.0)   . Migraines     There are no active problems to display for this patient.   Past Surgical History:  Procedure Laterality Date  . CESAREAN SECTION    . ESOPHAGEAL DILATION    . HERNIA REPAIR    . SPINE SURGERY    . TUBAL LIGATION       OB History   No obstetric history on file.      Home Medications    Prior to Admission medications   Medication Sig Start Date End Date Taking? Authorizing Provider  albuterol (PROVENTIL HFA;VENTOLIN HFA) 108 (90 Base) MCG/ACT inhaler Inhale 2 puffs into the lungs every 6 (six) hours as needed for wheezing or shortness of breath.    [provider]  doxycycline (VIBRAMYCIN) 100  MG capsule Take 100 mg by mouth 2 (two) times daily.    [provider]  hyoscyamine (LEVSIN SL) 0.125 MG SL tablet Place 1 tablet (0.125 mg total) under the tongue every 4 (four) hours as needed for cramping. Patient not taking: Reported on 11/04/2015 12/18/11 11/04/15  Le, Thao P, DO  mometasone-formoterol (DULERA) 200-5 MCG/ACT AERO Inhale 2 puffs into the lungs 2 (two) times daily.    [provider]  naproxen (NAPROSYN) 500 MG tablet Take 1 tablet (500 mg total) by mouth 2 (two) times daily. 11/04/15   Horton, Mayer Maskerourtney F, MD    Family History Family History  Problem Relation Age of Onset  . Diabetes Mother   . Heart disease Mother   . Hyperlipidemia Mother   . Hypertension Mother   . Diabetes Father   . Cancer Father   . Stroke Father   . Hypertension Father     Social History Social History   Tobacco Use  . Smoking status: Never Smoker  . Smokeless tobacco: Never Used  Substance Use Topics  . Alcohol use: No  . Drug use: No     Allergies   Mangifera indica; Topiramate; Clarithromycin; Latex; Penicillins; and Tape   Review of Systems Review of Systems  Constitutional: Negative for activity change, appetite change, fatigue and fever.  HENT: Negative  for congestion and rhinorrhea.   Respiratory: Positive for chest tightness and shortness of breath. Negative for cough.   Cardiovascular: Positive for chest pain.  Gastrointestinal: Positive for abdominal pain and nausea. Negative for vomiting.  Genitourinary: Negative for dysuria and hematuria.  Musculoskeletal: Negative for arthralgias, joint swelling and myalgias.  Skin: Negative for rash.  Neurological: Negative for dizziness, weakness and headaches.   all other systems are negative except as noted in the HPI and PMH.     Physical Exam Updated Vital Signs BP (!) 163/77   Pulse 73   Temp 97.9 F (36.6 C) (Oral)   Resp 17   Ht 5\' 4"  (1.626 m)   Wt 75.3 kg   SpO2 98%   BMI 28.49 kg/m    Physical Exam Vitals signs and nursing note reviewed.  Constitutional:      General: She is in acute distress.     Appearance: Normal appearance. She is well-developed and normal weight.     Comments: Distressed with conversation  HENT:     Head: Normocephalic and atraumatic.     Nose: Nose normal. No rhinorrhea.     Mouth/Throat:     Pharynx: No oropharyngeal exudate.  Eyes:     Conjunctiva/sclera: Conjunctivae normal.     Pupils: Pupils are equal, round, and reactive to light.  Neck:     Musculoskeletal: Normal range of motion and neck supple.     Comments: No meningismus. Cardiovascular:     Rate and Rhythm: Normal rate and regular rhythm.     Heart sounds: Normal heart sounds. No murmur.  Pulmonary:     Effort: Respiratory distress present.     Breath sounds: Normal breath sounds.     Comments: Increased work of breathing, dyspneic with conversation. clear lungs Abdominal:     Palpations: Abdomen is soft.     Tenderness: There is abdominal tenderness. There is no guarding or rebound.     Comments: Surgical incisions appear to be clean Diffuse tenderness without guarding or rebound  Musculoskeletal: Normal range of motion.        General: No tenderness.  Skin:    General: Skin is warm.     Capillary Refill: Capillary refill takes 2 to 3 seconds.  Neurological:     General: No focal deficit present.     Mental Status: She is alert and oriented to person, place, and time.     Cranial Nerves: No cranial nerve deficit.     Motor: No abnormal muscle tone.     Coordination: Coordination normal.     Comments:  5/5 strength throughout. CN 2-12 intact.Equal grip strength.   Psychiatric:        Behavior: Behavior normal.      ED Treatments / Results  Labs (all labs ordered are listed, but only abnormal results are displayed) Labs Reviewed  COMPREHENSIVE METABOLIC PANEL - Abnormal; Notable for the following components:      Result Value   Potassium 3.1 (*)     Glucose, Bld 106 (*)    ALT 49 (*)    All other components within normal limits  URINALYSIS, ROUTINE W REFLEX MICROSCOPIC - Abnormal; Notable for the following components:   Color, Urine STRAW (*)    pH 9.0 (*)    Ketones, ur 5 (*)    Leukocytes,Ua TRACE (*)    Bacteria, UA RARE (*)    All other components within normal limits  CBC WITH DIFFERENTIAL/PLATELET  LIPASE, BLOOD  TROPONIN I  EKG EKG Interpretation  Date/Time:  Thursday September 20 2018 00:56:52 EST Ventricular Rate:  82 PR Interval:    QRS Duration: 100 QT Interval:  385 QTC Calculation: 450 R Axis:   56 Text Interpretation:  Sinus rhythm Borderline low voltage, extremity leads No significant change was found Confirmed by Glynn Octave [phone]) on 09/20/2018 1:25:53 AM   Radiology Dg Chest 2 View  Result Date: 09/20/2018 CLINICAL DATA:  53 year old female with shortness of breath EXAM: CHEST - 2 VIEW COMPARISON:  Chest radiograph dated 09/29/2017 FINDINGS: The lungs are clear. There is no pleural effusion or pneumothorax. The cardiac silhouette is within normal limits. No acute osseous pathology. IMPRESSION: No active cardiopulmonary disease. Electronically Signed   By: Elgie Collard M.D.   On: 09/20/2018 02:22   Ct Angio Chest Pe W And/or Wo Contrast  Result Date: 09/20/2018 CLINICAL DATA:  Shortness of breath. Upper abdominal and chest pain. Status post cholecystectomy and hernia repair 2 days ago. History of renal cell carcinoma. EXAM: CT ANGIOGRAPHY CHEST CT ABDOMEN AND PELVIS WITH CONTRAST TECHNIQUE: Multidetector CT imaging of the chest was performed using the standard protocol during bolus administration of intravenous contrast. Multiplanar CT image reconstructions and MIPs were obtained to evaluate the vascular anatomy. Multidetector CT imaging of the abdomen and pelvis was performed using the standard protocol during bolus administration of intravenous contrast. CONTRAST:  ISOVUE-370 IOPAMIDOL  (ISOVUE-370) INJECTION 76% COMPARISON:  Chest radiograph September 20, 2018 FINDINGS: CTA CHEST FINDINGS CARDIOVASCULAR: Adequate contrast opacification of the pulmonary artery's. Main pulmonary artery is not enlarged. No pulmonary arterial filling defects to the level of the subsegmental branches. Heart size is normal. No pericardial effusion. Mild coronary artery calcifications. No pericardial effusion. Thoracic aorta is normal course and caliber, unremarkable. MEDIASTINUM/NODES: Small volume pneumomediastinum tracking into the anterior neck soft tissues. LUNGS/PLEURA: Small apicoposterior LEFT pneumothorax. Small LEFT pleural effusion. Bibasilar atelectasis. No focal consolidation. MUSCULOSKELETAL: Visualized soft tissues and included osseous structures appear normal. Review of the MIP images confirms the above findings. CT ABDOMEN and PELVIS FINDINGS HEPATOBILIARY: Status post cholecystectomy. Minimal postoperative free fluid within the gallbladder fossa. Normal liver. PANCREAS: Normal. SPLEEN: Normal. ADRENALS/URINARY TRACT: Kidneys are orthotopic, demonstrating symmetric enhancement. LEFT upper pole postoperative scarring. No nephrolithiasis, hydronephrosis or solid renal masses. Too small to characterize hypodensity lower pole LEFT kidney. The unopacified ureters are normal in course and caliber. Delayed imaging through the kidneys demonstrates symmetric prompt contrast excretion within the proximal urinary collecting system. Urinary bladder is partially distended and unremarkable. Lobulated LEFT adrenal contour without dominant nodule. STOMACH/BOWEL: Status post reported hiatal hernia repair 2 days ago. Paraesophageal hernia versus intussusception. LEFT para soft the Jewel edema, small volume fluid and gas. VASCULAR/LYMPHATIC: Aortoiliac vessels are normal in course and caliber. Mild calcific atherosclerosis. No lymphadenopathy by CT size criteria. REPRODUCTIVE: Normal. OTHER: Small volume free fluid in the  pelvis. No intraperitoneal free air or focal fluid collections. MUSCULOSKELETAL: Anterior abdominal wall fat stranding along surgical approach. L5-S1 PLIF. IMPRESSION: CTA CHEST: 1. No acute pulmonary embolism. 2. Perforated gastroesophageal intussusception or paraesophageal hernia, atypical appearance for hiatal hernia fundoplication. Patient at risk for mediastinitis. Pneumomediastinum tracking into neck. 3. Small LEFT hydropneumothorax. CTA ABDOMEN AND PELVIS: 1. Status post cholecystectomy with expected postoperative change. 2. Postoperative changes LEFT kidney. Critical Value/emergent results were called by telephone at the time of interpretation on 09/20/2018 at 4:04 am to Dr. Glynn Octave , who verbally acknowledged these results. Aortic Atherosclerosis (ICD10-I70.0). Electronically Signed   By: Michel Santee.D.  On: 09/20/2018 04:06   Ct Abdomen Pelvis W Contrast  Result Date: 09/20/2018 CLINICAL DATA:  Shortness of breath. Upper abdominal and chest pain. Status post cholecystectomy and hernia repair 2 days ago. History of renal cell carcinoma. EXAM: CT ANGIOGRAPHY CHEST CT ABDOMEN AND PELVIS WITH CONTRAST TECHNIQUE: Multidetector CT imaging of the chest was performed using the standard protocol during bolus administration of intravenous contrast. Multiplanar CT image reconstructions and MIPs were obtained to evaluate the vascular anatomy. Multidetector CT imaging of the abdomen and pelvis was performed using the standard protocol during bolus administration of intravenous contrast. CONTRAST:  ISOVUE-370 IOPAMIDOL (ISOVUE-370) INJECTION 76% COMPARISON:  Chest radiograph September 20, 2018 FINDINGS: CTA CHEST FINDINGS CARDIOVASCULAR: Adequate contrast opacification of the pulmonary artery's. Main pulmonary artery is not enlarged. No pulmonary arterial filling defects to the level of the subsegmental branches. Heart size is normal. No pericardial effusion. Mild coronary artery calcifications.  No pericardial effusion. Thoracic aorta is normal course and caliber, unremarkable. MEDIASTINUM/NODES: Small volume pneumomediastinum tracking into the anterior neck soft tissues. LUNGS/PLEURA: Small apicoposterior LEFT pneumothorax. Small LEFT pleural effusion. Bibasilar atelectasis. No focal consolidation. MUSCULOSKELETAL: Visualized soft tissues and included osseous structures appear normal. Review of the MIP images confirms the above findings. CT ABDOMEN and PELVIS FINDINGS HEPATOBILIARY: Status post cholecystectomy. Minimal postoperative free fluid within the gallbladder fossa. Normal liver. PANCREAS: Normal. SPLEEN: Normal. ADRENALS/URINARY TRACT: Kidneys are orthotopic, demonstrating symmetric enhancement. LEFT upper pole postoperative scarring. No nephrolithiasis, hydronephrosis or solid renal masses. Too small to characterize hypodensity lower pole LEFT kidney. The unopacified ureters are normal in course and caliber. Delayed imaging through the kidneys demonstrates symmetric prompt contrast excretion within the proximal urinary collecting system. Urinary bladder is partially distended and unremarkable. Lobulated LEFT adrenal contour without dominant nodule. STOMACH/BOWEL: Status post reported hiatal hernia repair 2 days ago. Paraesophageal hernia versus intussusception. LEFT para soft the Jewel edema, small volume fluid and gas. VASCULAR/LYMPHATIC: Aortoiliac vessels are normal in course and caliber. Mild calcific atherosclerosis. No lymphadenopathy by CT size criteria. REPRODUCTIVE: Normal. OTHER: Small volume free fluid in the pelvis. No intraperitoneal free air or focal fluid collections. MUSCULOSKELETAL: Anterior abdominal wall fat stranding along surgical approach. L5-S1 PLIF. IMPRESSION: CTA CHEST: 1. No acute pulmonary embolism. 2. Perforated gastroesophageal intussusception or paraesophageal hernia, atypical appearance for hiatal hernia fundoplication. Patient at risk for mediastinitis.  Pneumomediastinum tracking into neck. 3. Small LEFT hydropneumothorax. CTA ABDOMEN AND PELVIS: 1. Status post cholecystectomy with expected postoperative change. 2. Postoperative changes LEFT kidney. Critical Value/emergent results were called by telephone at the time of interpretation on 09/20/2018 at 4:04 am to Dr. Glynn Octave , who verbally acknowledged these results. Aortic Atherosclerosis (ICD10-I70.0). Electronically Signed   By: Awilda Metro M.D.   On: 09/20/2018 04:06    Procedures Procedures (including critical care time)  Medications Ordered in ED Medications - No data to display   Initial Impression / Assessment and Plan / ED Course  I have reviewed the triage vital signs and the nursing notes.  Pertinent labs & imaging results that were available during my care of the patient were reviewed by me and considered in my medical decision making (see chart for details).       Patient with shortness of breath since her discharge from the hospital yesterday.  2 brief episodes of left-sided chest pain going to her neck and arm lasting less than 30 seconds each.  Low suspicion for ACS.  EKG is normal sinus rhythm.  No wheezing on exam with  clear breath sounds.  No hypoxia.  Chest x-ray is negative.  Patient is dyspneic with conversation and continues to feel short of breath.  CT pulmonary embolism study will be obtained given her postoperative state.  CT results discussed with Dr. Karie Kirks of radiology. There is no pulmonary embolism but there is evidence of perforation of paraesophageal hernia with pneumomediastinum and small pneumothorax.  IV antibiotics started.  Patient will need transfer back to [facility] to see her surgery team.  Discussed with Dr. Lorin Picket covering for Dr. Lily Peer.  He accepts patient to the [facility] ED. Patient and family updated.    CRITICAL CARE Performed by: Glynn Octave Total critical care time: 60 minutes Critical care time was  exclusive of separately billable procedures and treating other patients. Critical care was necessary to treat or prevent imminent or life-threatening deterioration. Critical care was time spent personally by me on the following activities: development of treatment plan with patient and/or surrogate as well as nursing, discussions with consultants, evaluation of patient's response to treatment, examination of patient, obtaining history from patient or surrogate, ordering and performing treatments and interventions, ordering and review of laboratory studies, ordering and review of radiographic studies, pulse oximetry and re-evaluation of patient's condition.  Final Clinical Impressions(s) / ED Diagnoses   Final diagnoses:  Perforated viscus    ED Discharge Orders    None       Elif Yonts, Jeannett Senior, MD 09/20/18 0500

## 2018-09-20 NOTE — ED Notes (Signed)
The pt reports that she had a panic attack earlier tonight and she also had a migraine headache

## 2018-12-13 ENCOUNTER — Other Ambulatory Visit: Payer: Self-pay | Admitting: Obstetrics and Gynecology

## 2018-12-13 DIAGNOSIS — R921 Mammographic calcification found on diagnostic imaging of breast: Secondary | ICD-10-CM

## 2019-02-20 ENCOUNTER — Ambulatory Visit
Admission: RE | Admit: 2019-02-20 | Discharge: 2019-02-20 | Disposition: A | Discharge status: Home / Self Care | Payer: Medicare Other | Source: Ambulatory Visit | Attending: Obstetrics and Gynecology | Admitting: Obstetrics and Gynecology

## 2019-02-20 ENCOUNTER — Other Ambulatory Visit: Payer: Self-pay

## 2019-02-20 DIAGNOSIS — R921 Mammographic calcification found on diagnostic imaging of breast: Secondary | ICD-10-CM

## 2019-06-10 ENCOUNTER — Other Ambulatory Visit: Payer: Self-pay | Admitting: Obstetrics and Gynecology

## 2019-06-10 DIAGNOSIS — Z1231 Encounter for screening mammogram for malignant neoplasm of breast: Secondary | ICD-10-CM

## 2019-08-02 ENCOUNTER — Ambulatory Visit: Payer: Medicare Other

## 2019-09-03 ENCOUNTER — Ambulatory Visit
Admission: RE | Admit: 2019-09-03 | Discharge: 2019-09-03 | Disposition: A | Discharge status: Home / Self Care | Payer: Medicare Other | Source: Ambulatory Visit | Attending: Obstetrics and Gynecology | Admitting: Obstetrics and Gynecology

## 2019-09-03 ENCOUNTER — Other Ambulatory Visit: Payer: Self-pay

## 2019-09-03 DIAGNOSIS — Z1231 Encounter for screening mammogram for malignant neoplasm of breast: Secondary | ICD-10-CM

## 2019-09-06 ENCOUNTER — Other Ambulatory Visit: Payer: Self-pay | Admitting: Obstetrics and Gynecology

## 2019-09-06 DIAGNOSIS — R928 Other abnormal and inconclusive findings on diagnostic imaging of breast: Secondary | ICD-10-CM

## 2019-09-20 ENCOUNTER — Ambulatory Visit
Admission: RE | Admit: 2019-09-20 | Discharge: 2019-09-20 | Disposition: A | Discharge status: Home / Self Care | Payer: Medicare Other | Source: Ambulatory Visit | Attending: Obstetrics and Gynecology | Admitting: Obstetrics and Gynecology

## 2019-09-20 ENCOUNTER — Other Ambulatory Visit: Payer: Self-pay

## 2019-09-20 DIAGNOSIS — R928 Other abnormal and inconclusive findings on diagnostic imaging of breast: Secondary | ICD-10-CM

## 2020-02-17 IMAGING — MG DIGITAL DIAGNOSTIC BILATERAL MAMMOGRAM WITH TOMO AND CAD
8 of 14 series · 8 of 38 positions shown · non-contrast
Comparison: Previous exam(s).

CLINICAL DATA: 52-year-old female with a palpable area of concern
in the right breast. The patient states this palpable area has been
present for approximately 15 years however appears more firm and
larger. In addition, she feels as if this area extends into the
right armpit.

EXAM:
DIGITAL DIAGNOSTIC BILATERAL MAMMOGRAM WITH CAD AND TOMO
RIGHT BREAST ULTRASOUND

[L ML]
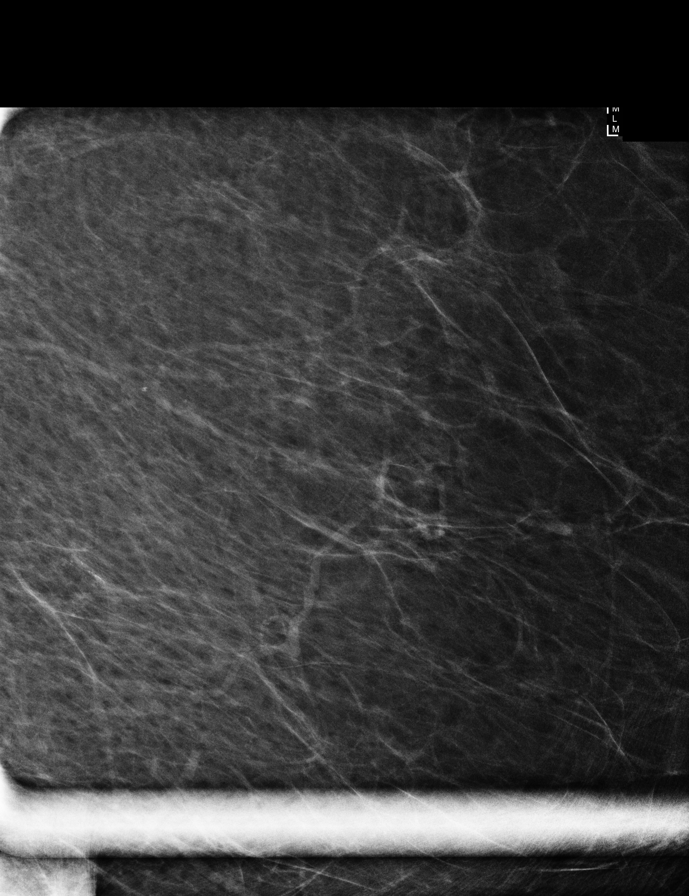

[L CC]
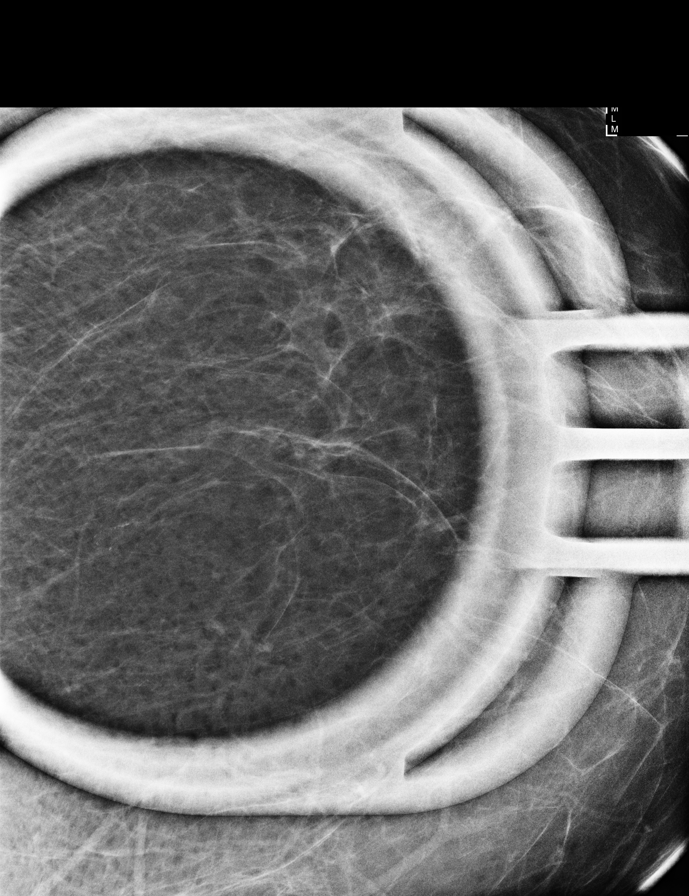

[R CC synth-2D]
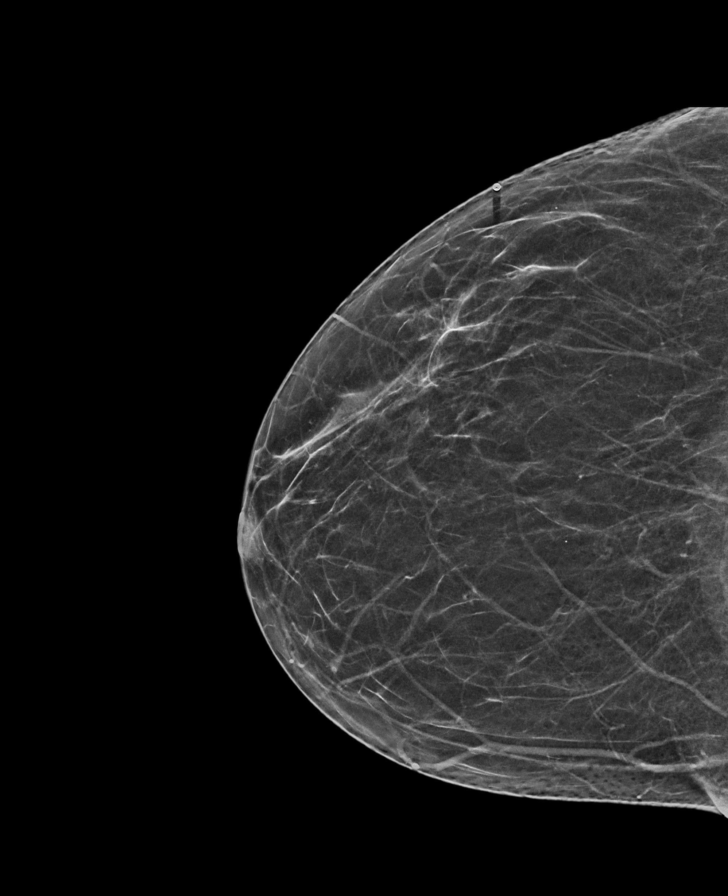

[R TAN synth-2D]
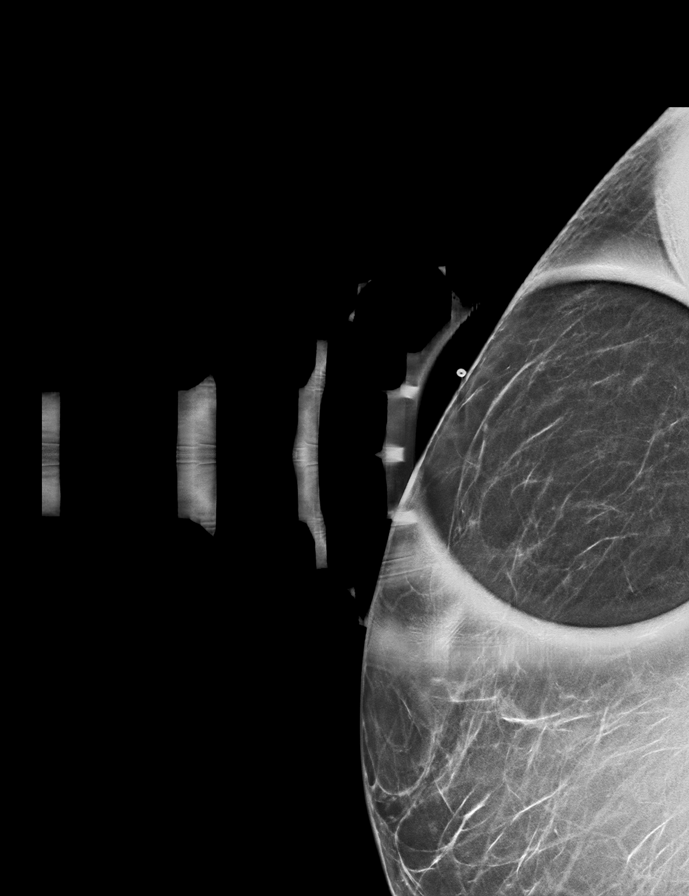

[L MLO synth-2D]
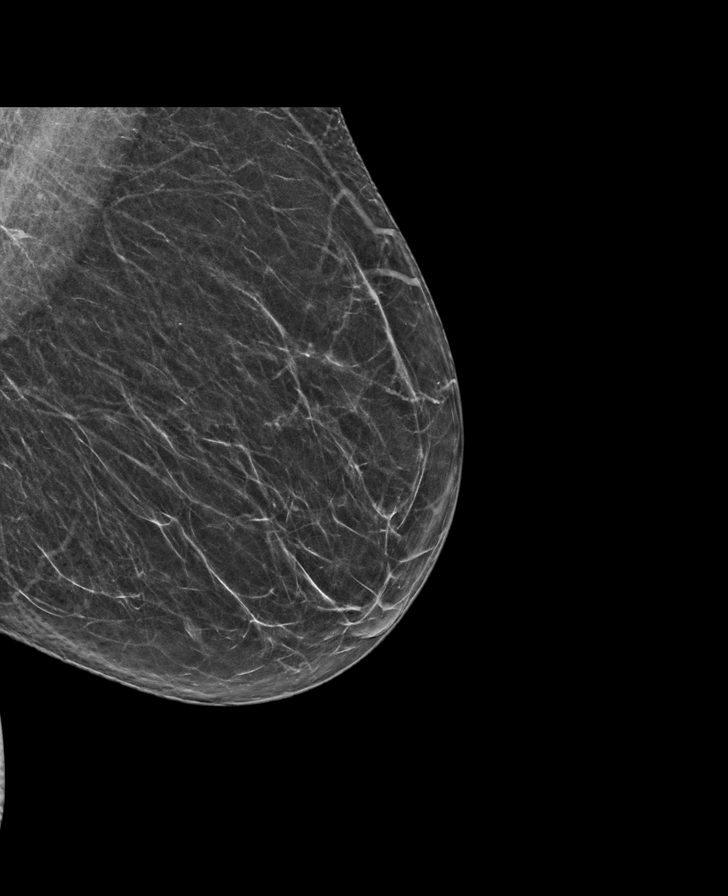

[L CC synth-2D (1 of 2)]
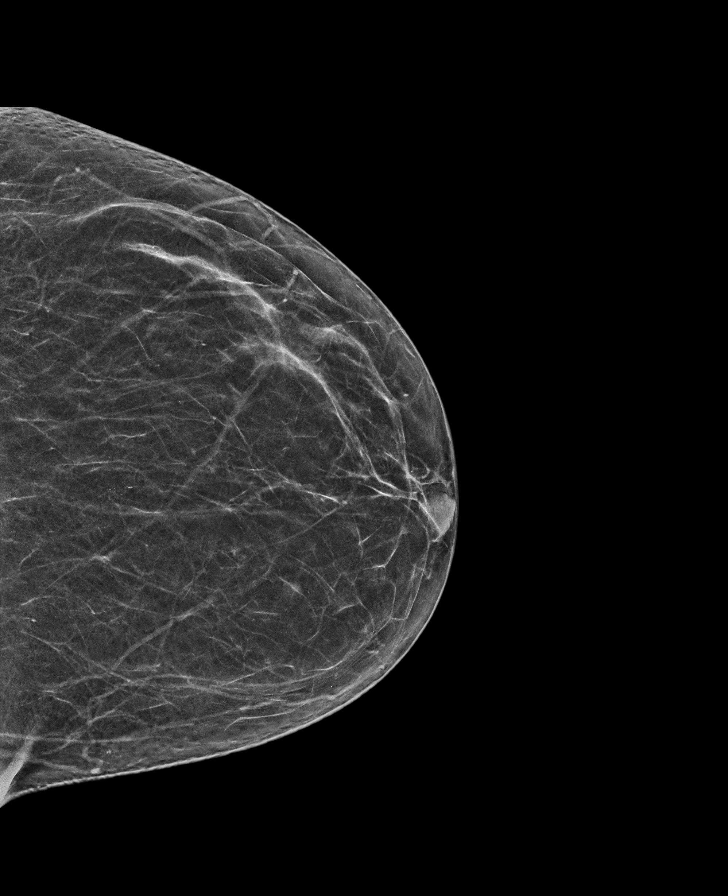

[R MLO synth-2D]
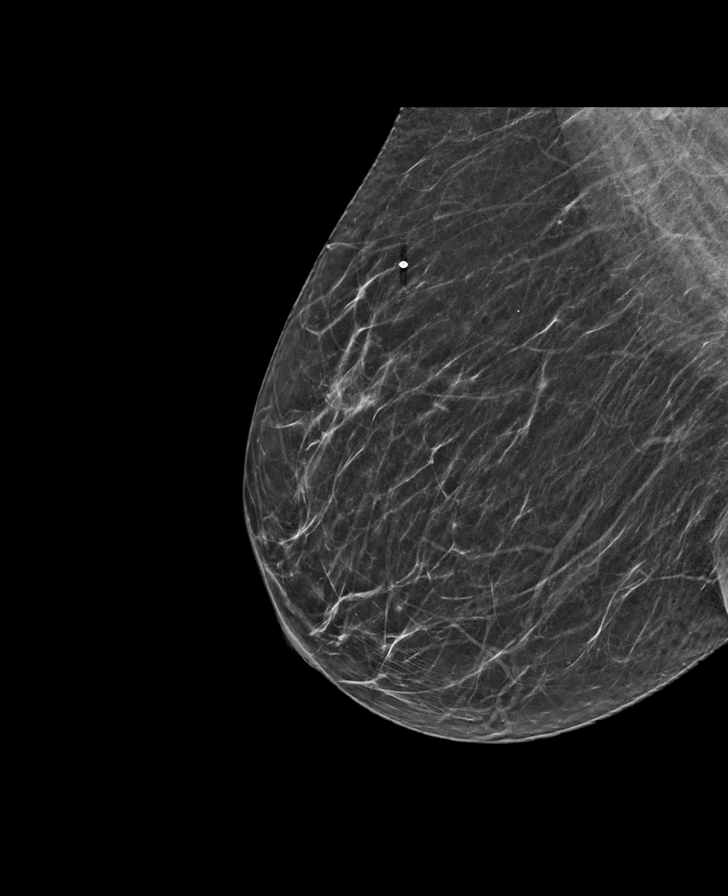

[L CC synth-2D (2 of 2)]
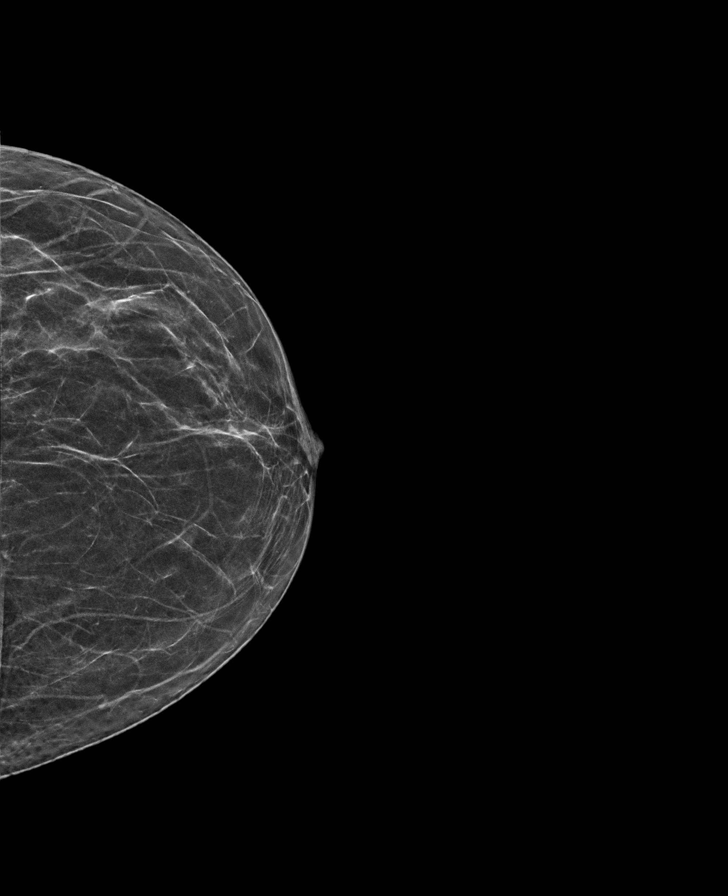

[8 of 38 positions shown; findings below may reference images not displayed]

ACR Breast Density Category b: There are scattered areas of
fibroglandular density.
FINDINGS: No suspicious masses or calcifications are seen in the right breast.
Spot compression tangential tomograms were performed over the
palpable area of concern in the upper-outer right breast with no
abnormalities identified. There are faint indeterminate
calcifications in the posterior central left breast which appear
somewhat linear oriented measuring 7 mm.

Mammographic images were processed with CAD.

Physical examination of the upper-outer right breast reveals a large
area of thickening at the approximate 10 to 11 o'clock position.

Targeted ultrasound of the right breast was performed. No suspicious
masses or abnormality seen, only normal-appearing heterogeneous
fibroglandular tissue is seen in the outer right breast. No
abnormality seen in the right axilla.
IMPRESSION: 1. No findings of malignancy in the right breast. No abnormalities
identified at site of palpable concern in the upper-outer right
breast.

2.  Indeterminate left breast calcifications.

RECOMMENDATION:
1. Stereotactic guided biopsy of the left breast calcifications is
recommended. This will be scheduled for the patient.

2. Recommend further management of the right breast palpable
abnormality be based on clinical assessment.

I have discussed the findings and recommendations with the patient.
Results were also provided in writing at the conclusion of the
visit. If applicable, a reminder letter will be sent to the patient
regarding the next appointment.

BI-RADS CATEGORY  4: Suspicious.

## 2020-07-23 ENCOUNTER — Other Ambulatory Visit: Payer: Self-pay | Admitting: Obstetrics and Gynecology

## 2020-07-23 DIAGNOSIS — Z1231 Encounter for screening mammogram for malignant neoplasm of breast: Secondary | ICD-10-CM

## 2020-09-04 ENCOUNTER — Other Ambulatory Visit: Payer: Self-pay

## 2020-09-04 ENCOUNTER — Other Ambulatory Visit: Payer: Self-pay | Admitting: Obstetrics and Gynecology

## 2020-09-04 ENCOUNTER — Ambulatory Visit
Admission: RE | Admit: 2020-09-04 | Discharge: 2020-09-04 | Disposition: A | Discharge status: Home / Self Care | Payer: Medicare Other | Source: Ambulatory Visit | Attending: Obstetrics and Gynecology | Admitting: Obstetrics and Gynecology

## 2020-09-04 DIAGNOSIS — Z1231 Encounter for screening mammogram for malignant neoplasm of breast: Secondary | ICD-10-CM

## 2020-09-04 DIAGNOSIS — N63 Unspecified lump in unspecified breast: Secondary | ICD-10-CM

## 2020-11-24 ENCOUNTER — Other Ambulatory Visit: Payer: Self-pay

## 2020-11-24 ENCOUNTER — Other Ambulatory Visit: Payer: Self-pay | Admitting: Obstetrics and Gynecology

## 2020-11-24 ENCOUNTER — Ambulatory Visit
Admission: RE | Admit: 2020-11-24 | Discharge: 2020-11-24 | Disposition: A | Discharge status: Home / Self Care | Payer: Medicare Other | Source: Ambulatory Visit | Attending: Obstetrics and Gynecology | Admitting: Obstetrics and Gynecology

## 2020-11-24 DIAGNOSIS — N63 Unspecified lump in unspecified breast: Secondary | ICD-10-CM

## 2021-01-05 ENCOUNTER — Other Ambulatory Visit: Payer: Medicare Other

## 2021-11-28 ENCOUNTER — Encounter: Payer: Self-pay | Admitting: Emergency Medicine

## 2021-11-28 ENCOUNTER — Ambulatory Visit
Admission: EM | Admit: 2021-11-28 | Discharge: 2021-11-28 | Disposition: A | Discharge status: Home / Self Care | Payer: Medicare Other | Attending: Internal Medicine | Admitting: Internal Medicine

## 2021-11-28 DIAGNOSIS — R110 Nausea: Secondary | ICD-10-CM | POA: Diagnosis not present

## 2021-11-28 DIAGNOSIS — N3001 Acute cystitis with hematuria: Secondary | ICD-10-CM

## 2021-11-28 DIAGNOSIS — M545 Low back pain, unspecified: Secondary | ICD-10-CM | POA: Insufficient documentation

## 2021-11-28 LAB — POCT URINALYSIS DIP (MANUAL ENTRY)
Bilirubin, UA: NEGATIVE
Glucose, UA: NEGATIVE mg/dL
Ketones, POC UA: NEGATIVE mg/dL
Nitrite, UA: NEGATIVE
Protein Ur, POC: NEGATIVE mg/dL
Spec Grav, UA: 1.015 (ref 1.010–1.025)
Urobilinogen, UA: 0.2 E.U./dL
pH, UA: 5.5 (ref 5.0–8.0)

## 2021-11-28 MED ORDER — CIPROFLOXACIN HCL 500 MG PO TABS: 500.0000 mg | ORAL_TABLET | Freq: Two times a day (BID) | ORAL | 0 refills | Status: DC

## 2021-11-28 NOTE — ED Provider Notes (Signed)
?[facility] ? ? ? ?CSN: [id number] ?Arrival date & time: 11/28/21  1442 ? ? ?  ? ?History   ?Chief Complaint ?Chief Complaint  ?Patient presents with  ? Possible UTI  ? ? ?HPI ?Kathryn Colon is a 56 y.o. female.  ? ?Patient presents with right lower back pain and nausea without vomiting that started yesterday.  Patient is concerned for UTI but denies urinary burning, urinary urgency, urinary frequency, hematuria, vaginal discharge, fever.  Patient reports that she recently had preop evaluation for back surgery and they advised her that she had a urinary tract infection from a urine sample.  She was prescribed Macrobid but reports that she did not complete the medication because she has a hard time swallowing capsules.  He then had a right lower tooth infection and was not prescribed antibiotics for this.  Patient has lower back pain so is not sure if this back pain is related to her chronic back pain.  She wanted to have surgery a few days prior but was not able to have surgery due to recent tooth infection. ? ? ? ?Past Medical History:  ?Diagnosis Date  ? Asthma   ? Headache(784.0)   ? Migraines   ? ? ?There are no problems to display for this patient. ? ? ?Past Surgical History:  ?Procedure Laterality Date  ? CESAREAN SECTION    ? ESOPHAGEAL DILATION    ? HERNIA REPAIR    ? SPINE SURGERY    ? TUBAL LIGATION    ? ? ?OB History   ?No obstetric history on file. ?  ? ? ? ?Home Medications   ? ?Prior to Admission medications   ?Medication Sig Start Date End Date Taking? Authorizing Provider  ?acetaminophen (TYLENOL) 160 MG/5ML suspension Take 960 mg by mouth every 6 (six) hours as needed (for migraines).   Yes [provider]  ?albuterol (PROVENTIL HFA;VENTOLIN HFA) 108 (90 Base) MCG/ACT inhaler Inhale 2 puffs into the lungs every 6 (six) hours as needed for wheezing or shortness of breath.   Yes [provider]  ?calcium carbonate (TUMS - DOSED IN MG ELEMENTAL CALCIUM) 500 MG  chewable tablet Chew 1-2 tablets by mouth as needed for indigestion or heartburn.    Yes [provider]  ?ciprofloxacin (CIPRO) 500 MG tablet Take 1 tablet (500 mg total) by mouth every 12 (twelve) hours. 11/28/21  Yes Gustavus BryantMound, Avigayil Ton E, FNP  ?ENSURE (ENSURE) Take 237 mLs by mouth 2 (two) times daily between meals.   Yes [provider]  ?Dorise HissErenumab-aooe 70 MG/ML SOAJ Inject 70 mg into the skin every 30 (thirty) days.   Yes [provider]  ?ibuprofen (ADVIL,MOTRIN) 100 MG/5ML suspension Take 400-500 mg by mouth every 6 (six) hours as needed (for pain).   Yes [provider]  ?naproxen (NAPROSYN) 500 MG tablet Take 1 tablet (500 mg total) by mouth 2 (two) times daily. 11/04/15  Yes Horton, Mayer Maskerourtney F, MD  ?ondansetron (ZOFRAN-ODT) 4 MG disintegrating tablet Take 4 mg by mouth every 8 (eight) hours as needed for nausea or vomiting. Prn  09/18/18  Yes [provider]  ?simethicone (MYLICON) 125 MG chewable tablet Chew 125 mg by mouth every 6 (six) hours as needed for flatulence.   Yes [provider]  ?SYMBICORT 160-4.5 MCG/ACT inhaler Inhale 2 puffs into the lungs 2 (two) times daily. 08/27/18  Yes [provider]  ?hyoscyamine (LEVSIN SL) 0.125 MG SL tablet Place 1 tablet (0.125 mg total) under the tongue  every 4 (four) hours as needed for cramping. ?Patient not taking: Reported on 09/20/2018 12/18/11 09/20/18  Hamilton Capri P, DO  ? ? ?Family History ?Family History  ?Problem Relation Age of Onset  ? Diabetes Mother   ? Heart disease Mother   ? Hyperlipidemia Mother   ? Hypertension Mother   ? Diabetes Father   ? Cancer Father   ? Stroke Father   ? Hypertension Father   ? ? ?Social History ?Social History  ? ?Tobacco Use  ? Smoking status: Never  ? Smokeless tobacco: Never  ?Substance Use Topics  ? Alcohol use: No  ? Drug use: No  ? ? ? ?Allergies   ?Mangifera indica, Topiramate, Other, Prednisone, Red dye, Aspirin, Clarithromycin, Latex, Penicillins, Tape, and  Tramadol ? ? ?Review of Systems ?Review of Systems ?Per HPI ? ?Physical Exam ?Triage Vital Signs ?ED Triage Vitals  ?Enc Vitals Group  ?   BP 11/28/21 1522 125/79  ?   Pulse Rate 11/28/21 1522 94  ?   Resp 11/28/21 1522 18  ?   Temp 11/28/21 1522 97.9 ?F (36.6 ?C)  ?   Temp Source 11/28/21 1522 Oral  ?   SpO2 11/28/21 1522 95 %  ?   Weight --   ?   Height --   ?   Head Circumference --   ?   Peak Flow --   ?   Pain Score 11/28/21 1524 0  ?   Pain Loc --   ?   Pain Edu? --   ?   Excl. in GC? --   ? ?No data found. ? ?Updated Vital Signs ?BP 125/79 (BP Location: Left Arm)   Pulse 94   Temp 97.9 ?F (36.6 ?C) (Oral)   Resp 18   SpO2 95%  ? ?Visual Acuity ?Right Eye Distance:   ?Left Eye Distance:   ?Bilateral Distance:   ? ?Right Eye Near:   ?Left Eye Near:    ?Bilateral Near:    ? ?Physical Exam ?Constitutional:   ?   General: She is not in acute distress. ?   Appearance: Normal appearance. She is not toxic-appearing or diaphoretic.  ?HENT:  ?   Head: Normocephalic and atraumatic.  ?Eyes:  ?   Extraocular Movements: Extraocular movements intact.  ?   Conjunctiva/sclera: Conjunctivae normal.  ?Cardiovascular:  ?   Rate and Rhythm: Normal rate and regular rhythm.  ?   Pulses: Normal pulses.  ?   Heart sounds: Normal heart sounds.  ?Pulmonary:  ?   Effort: Pulmonary effort is normal. No respiratory distress.  ?   Breath sounds: Normal breath sounds.  ?Abdominal:  ?   General: Bowel sounds are normal. There is no distension.  ?   Palpations: Abdomen is soft.  ?   Tenderness: There is no abdominal tenderness.  ?Neurological:  ?   General: No focal deficit present.  ?   Mental Status: She is alert and oriented to person, place, and time. Mental status is at baseline.  ?Psychiatric:     ?   Mood and Affect: Mood normal.     ?   Behavior: Behavior normal.     ?   Thought Content: Thought content normal.     ?   Judgment: Judgment normal.  ? ? ? ?UC Treatments / Results  ?Labs ?(all labs ordered are listed, but only  abnormal results are displayed) ?Labs Reviewed  ?POCT URINALYSIS DIP (MANUAL ENTRY) - Abnormal; Notable for the following components:  ?  Result Value  ? Blood, UA trace-intact (*)   ? Leukocytes, UA Small (1+) (*)   ? All other components within normal limits  ?URINE CULTURE  ? ? ?EKG ? ? ?Radiology ?No results found. ? ?Procedures ?Procedures (including critical care time) ? ?Medications Ordered in UC ?Medications - No data to display ? ?Initial Impression / Assessment and Plan / UC Course  ?I have reviewed the triage vital signs and the nursing notes. ? ?Pertinent labs & imaging results that were available during my care of the patient were reviewed by me and considered in my medical decision making (see chart for details). ? ?  ? ?Urinalysis indicating urinary tract infection.  Limited options on treatment given patient's multiple allergies.  Patient is allergic to Macrobid, cephalosporins, clarithromycin, sulfa medications, penicillins.  Only viable option at this point is Cipro antibiotic.  There does not appear to be any contraindications to Cipro at this time.  Urine culture is pending.  Discussed strict return and ER precautions.  Patient verbalized understanding and was agreeable with plan. ?Final Clinical Impressions(s) / UC Diagnoses  ? ?Final diagnoses:  ?Acute cystitis with hematuria  ?Nausea without vomiting  ?Acute right-sided low back pain without sciatica  ? ? ? ?Discharge Instructions   ? ?  ?You are being treated with an antibiotic for urinary tract infection.  Please avoid any strenuous physical activity while taking this antibiotic.  Urine culture is pending.  We will call if there are any abnormalities. ? ? ? ?ED Prescriptions   ? ? Medication Sig Dispense Auth. Provider  ? ciprofloxacin (CIPRO) 500 MG tablet Take 1 tablet (500 mg total) by mouth every 12 (twelve) hours. 10 tablet Ervin Knack E, [place]  ? ?  ? ?PDMP not reviewed this encounter. ?  ?Gustavus Bryant, [place] ?11/28/21 1611 ? ?

## 2021-11-28 NOTE — ED Triage Notes (Signed)
Patient c/o low back pain, nausea, no dysuria, no urgency or frequency since yesterday. ?

## 2021-11-28 NOTE — Discharge Instructions (Signed)
You are being treated with an antibiotic for urinary tract infection.  Please avoid any strenuous physical activity while taking this antibiotic.  Urine culture is pending.  We will call if there are any abnormalities. ?

## 2021-11-30 LAB — URINE CULTURE: Culture: NO GROWTH

## 2022-08-25 NOTE — Progress Notes (Signed)
Referring:  Mort Sawyers, [address] [place],  [place] [id number]  PCP: Vashti Hey, PA-C  Neurology was asked to evaluate Kathryn Colon, a 57 year old female for a chief complaint of headaches.  Our recommendations of care will be communicated by shared medical record.    CC:  headaches  History provided from self, husband  HPI:  Medical co-morbidities: CAD, MI s/p stent, HTN, RLS, HLD, renal cell carcinoma  The patient presents for evaluation of headaches which began around 2001. Her headaches have been worsening over the past 6 months. She has started to have new shooting pains from her occiput radiating forward. She continues to have migraines which are associated with photophobia, phonophobia, and nausea. They can last for several hours at a time.  She has been on Aimovig for several years for migraine preention. Her last injection was in December 2023. Headaches have been worse since missing her last dose of medication. She now has headaches every day to every other day. However she notes that headaches had been worsening even prior to missing her dose of Aimovig and she feels it was becoming less effective.  Headache History: Onset: 2001 Aura: none Location: temporal, occipital Quality/Description: shooting pain Associated Symptoms:  Photophobia: yes  Phonophobia: yes  Nausea: yes Worse with activity?: yes Duration of headaches: several hours  Migraine days per month: 15 Headache free days per month: 15  Current Treatment: Abortive Tylenol Baclofen 10 mg TID  Preventative none  Prior Therapies                                 Rescue: Imitrex - contraindicated due to MI Maxalt - syncope, contraindicated due to MI Ubrelvy 100 mg PRN Baclofen 10 mg TID PRN  Prevention: Metoprolol Depakote Topamax Gabapentin Amitriptyline Nortriptyline Effexor Aimovig 70 mg and 140 mg monthly Botox Trigger point injections   LABS: CBC     Component Value Date/Time   WBC 9.1 09/20/2018 0126   RBC 4.68 09/20/2018 0126   HGB 13.2 09/20/2018 0126   HCT 40.9 09/20/2018 0126   PLT 268 09/20/2018 0126   MCV 87.4 09/20/2018 0126   MCV 92.5 12/14/2011 1917   MCH 28.2 09/20/2018 0126   MCHC 32.3 09/20/2018 0126   RDW 13.5 09/20/2018 0126   LYMPHSABS 1.8 09/20/2018 0126   MONOABS 0.9 09/20/2018 0126   EOSABS 0.1 09/20/2018 0126   BASOSABS 0.0 09/20/2018 0126      Latest Ref Rng & Units 09/20/2018    1:26 AM 11/04/2015    2:34 AM 12/14/2011    7:13 PM  CMP  Glucose 70 - 99 mg/dL 106  126  82   BUN 6 - 20 mg/dL 6  8  6    Creatinine 0.44 - 1.00 mg/dL 0.67  0.72  0.54   Sodium 135 - 145 mmol/L 138  136  140   Potassium 3.5 - 5.1 mmol/L 3.1  4.1  4.0   Chloride 98 - 111 mmol/L 105  100  106   CO2 22 - 32 mmol/L 23  23  24    Calcium 8.9 - 10.3 mg/dL 9.4  10.0  9.5   Total Protein 6.5 - 8.1 g/dL 7.1   7.4   Total Bilirubin 0.3 - 1.2 mg/dL 0.5   0.5   Alkaline Phos 38 - 126 U/L 53   53   AST 15 - 41 U/L 40  18   ALT 0 - 44 U/L 49   16      IMAGING:  [facility] 07/21/2010: unremarkable  Imaging independently reviewed on August 26, 2022   Current Outpatient Medications on File Prior to Visit  Medication Sig Dispense Refill   acetaminophen (TYLENOL) 160 MG/5ML suspension Take 960 mg by mouth every 6 (six) hours as needed (for migraines).     albuterol (PROVENTIL HFA;VENTOLIN HFA) 108 (90 Base) MCG/ACT inhaler Inhale 2 puffs into the lungs every 6 (six) hours as needed for wheezing or shortness of breath.     aspirin EC 81 MG tablet Take by mouth.     calcium carbonate (OS-CAL) 1250 (500 Ca) MG chewable tablet Chew by mouth.     calcium carbonate (TUMS - DOSED IN MG ELEMENTAL CALCIUM) 500 MG chewable tablet Chew 1-2 tablets by mouth as needed for indigestion or heartburn.      Dupilumab 300 MG/2ML SOPN Inject into the skin.     ezetimibe (ZETIA) 10 MG tablet Take 10 mg by mouth daily.     fenofibrate 160 MG tablet Take 160 mg  by mouth daily.     metoprolol succinate (TOPROL-XL) 25 MG 24 hr tablet Take 25 mg by mouth daily.     naproxen (NAPROSYN) 500 MG tablet Take 1 tablet (500 mg total) by mouth 2 (two) times daily. 30 tablet 0   nitroGLYCERIN (NITROSTAT) 0.4 MG SL tablet Place under the tongue.     pantoprazole (PROTONIX) 40 MG tablet Take 1 tablet by mouth daily.     rosuvastatin (CRESTOR) 5 MG tablet Take 5 mg by mouth daily.     silver sulfADIAZINE (SILVADENE) 1 % cream Apply to the open areas daily.     simethicone (MYLICON) 500 MG chewable tablet Chew 125 mg by mouth every 6 (six) hours as needed for flatulence.     SYMBICORT 160-4.5 MCG/ACT inhaler Inhale 2 puffs into the lungs 2 (two) times daily.     hyoscyamine (LEVSIN SL) 0.125 MG SL tablet Place 1 tablet (0.125 mg total) under the tongue every 4 (four) hours as needed for cramping. (Patient not taking: Reported on 09/20/2018) 30 tablet 0   No current facility-administered medications on file prior to visit.     Allergies: Allergies  Allergen Reactions   Mangifera Indica Anaphylaxis and Swelling    MANGO: Cannot even smell this   Topiramate Other (See Comments) and Palpitations    "I cannot function"   Other Other (See Comments)    CANNOT SWALLOW PILLS- STATED SHE WILL CHOKE   Prednisone Diarrhea and Other (See Comments)    Not able to tolerate due to IBS (Cannot take oral form, but can take via IV) Causes SEVERE STOMACH PAIN, too   Red Dye Itching   Aspirin Other (See Comments)    GI Upset- stomach burns   Clarithromycin Itching and Rash   Latex Rash    "I must have paper tape"   Penicillins Itching and Rash    Did it involve swelling of the face/tongue/throat, SOB, or low BP? Yes Did it involve sudden or severe rash/hives, skin peeling, or any reaction on the inside of your mouth or nose? No Did you need to seek medical attention at a hospital or doctor's office? No When did it last happen? Over 10 years ago  If all above answers are  "NO", may proceed with cephalosporin use.    Tape Rash    No latex in [place] paper tape only  Tramadol Itching    Family History: Migraine or other headaches in the family:  mother had headaches Aneurysms in a first degree relative:  no Brain tumors in the family:  no Other neurological illness in the family:   dad had strokes  Past Medical History: Past Medical History:  Diagnosis Date   Asthma    Headache(784.0)    Migraines     Past Surgical History Past Surgical History:  Procedure Laterality Date   CESAREAN SECTION     ESOPHAGEAL DILATION     HERNIA REPAIR     SPINE SURGERY     TUBAL LIGATION      Social History: Social History   Tobacco Use   Smoking status: Never   Smokeless tobacco: Never  Substance Use Topics   Alcohol use: No   Drug use: No    ROS: Negative for fevers, chills. Positive for headaches, dizziness. All other systems reviewed and negative unless stated otherwise in HPI.   Physical Exam:   Vital Signs: Ht 5\' 4"  (1.626 m)   Wt 164 lb (74.4 kg)   BMI 28.15 kg/m  GENERAL: well appearing,in no acute distress,alert SKIN:  Color, texture, turgor normal. No rashes or lesions HEAD:  Normocephalic/atraumatic. CV:  RRR RESP: Normal respiratory effort MSK: +tenderness to palpation over bilateral occiput, neck, and shoulders  NEUROLOGICAL: Mental Status: Alert, oriented to person, place and time,Follows commands Cranial Nerves: PERRL, visual fields intact to confrontation, extraocular movements intact, facial sensation intact, no facial droop or ptosis, hearing grossly intact, no dysarthria Motor: muscle strength 5/5 both upper and lower extremities,no drift, normal tone Reflexes: 2+ throughout Sensation: intact to light touch all 4 extremities Coordination: Finger-to- nose-finger intact bilaterally Gait: normal-based   IMPRESSION: 57 year old female with a history of CAD, MI s/p stent, HTN, RLS, HLD, renal cell carcinoma who  presents for evaluation of worsening headaches. Will order brain MRI given change in pattern of headache and history of cancer. She continued to have frequent headaches even while on Aimovig. Will switch to [facility] and see if this is any more effective. Triptans are contraindicated due to CAD, and she is unable to afford Ubrelvy for rescue. Will start Reyvow for rescue. Link to [employer] provided for financial assistance. Exam reveals tenderness to palpation over bilateral occiput, suggestive of occipital neuralgia. Referral to neck PT placed for management of cervicalgia and occipital neuralgia.  PLAN: -MRI brain  -Prevention: Start Emgality 120 mg monthly -Rescue: Start Reyvow 50 mg PRN -Referral to neck PT -Link to [employer] provided for financial assistance  I spent a total of 36 minutes chart reviewing and counseling the patient. Headache education was done. Discussed treatment options including preventive and acute medications, and physical therapy. Discussed medication side effects, adverse reactions and drug interactions. Written educational materials and patient instructions outlining all of the above were given.  Follow-up: 6 months   Genia Harold, MD 08/26/2022   10:44 AM

## 2022-08-26 ENCOUNTER — Telehealth: Payer: Self-pay | Admitting: Psychiatry

## 2022-08-26 ENCOUNTER — Encounter: Payer: Self-pay | Admitting: Psychiatry

## 2022-08-26 ENCOUNTER — Ambulatory Visit: Payer: Medicare Other | Admitting: Psychiatry

## 2022-08-26 VITALS — Ht 64.0 in | Wt 164.0 lb

## 2022-08-26 DIAGNOSIS — M5481 Occipital neuralgia: Secondary | ICD-10-CM

## 2022-08-26 DIAGNOSIS — M542 Cervicalgia: Secondary | ICD-10-CM

## 2022-08-26 DIAGNOSIS — G43719 Chronic migraine without aura, intractable, without status migrainosus: Secondary | ICD-10-CM

## 2022-08-26 DIAGNOSIS — G4452 New daily persistent headache (NDPH): Secondary | ICD-10-CM | POA: Diagnosis not present

## 2022-08-26 MED ORDER — LASMIDITAN SUCCINATE 50 MG PO TABS: 50.0000 mg | ORAL_TABLET | ORAL | 6 refills | Status: DC | PRN

## 2022-08-26 MED ORDER — EMGALITY 120 MG/ML ~~LOC~~ SOAJ: 1.0000 | SUBCUTANEOUS | 5 refills | Status: DC

## 2022-08-26 MED ORDER — EMGALITY 120 MG/ML ~~LOC~~ SOAJ: 2.0000 | Freq: Once | SUBCUTANEOUS | 0 refills | Status: AC

## 2022-08-26 MED ORDER — LORAZEPAM 2 MG/ML PO CONC: 1.0000 mg | Freq: Once | ORAL | 0 refills | Status: AC

## 2022-08-26 NOTE — Telephone Encounter (Signed)
[employer] medicare NPR sent to GI [phone]

## 2022-08-26 NOTE — Patient Instructions (Addendum)
[email or web address]  Start lasmiditan as needed for migraines. Take one pill at onset of migraine. Do not drive for 6 hours after taking this medication.  Start Emgality monthly injection for migraine prevention  Referral to neck PT   MRI of the brain. Take ativan 30 minutes prior to MRI

## 2022-09-01 ENCOUNTER — Telehealth: Payer: Self-pay | Admitting: Psychiatry

## 2022-09-01 ENCOUNTER — Telehealth: Payer: Self-pay | Admitting: *Deleted

## 2022-09-01 MED ORDER — LORAZEPAM 0.5 MG PO TABS: ORAL_TABLET | ORAL | 0 refills | Status: DC

## 2022-09-01 MED ORDER — LASMIDITAN SUCCINATE 50 MG PO TABS: 50.0000 mg | ORAL_TABLET | ORAL | 6 refills | Status: DC | PRN

## 2022-09-01 NOTE — Telephone Encounter (Signed)
Pt being told that a PA is needed for the Galcanezumab-gnlm (EMGALITY) 120 MG/ML SOAJ.  I called [facility] and was told it does need PA.

## 2022-09-01 NOTE — Telephone Encounter (Signed)
I sent in new orders for her, thanks

## 2022-09-01 NOTE — Telephone Encounter (Signed)
Pt is asking for a call to ensure that the Rennie Natter is called in for her upcoming MRI on 03-08.  Pt is also asking that her migraine medication being called in Pt states  [place]   is telling her they have not received the script for :Lasmiditan Succinate 50 MG TABS .  Pt being told that a PA is needed for the Galcanezumab-gnlm (EMGALITY) 120 MG/ML SOAJ

## 2022-09-01 NOTE — Telephone Encounter (Signed)
Patient informed, message sent to prior auth team to start PA on [facility] .

## 2022-09-01 NOTE — Telephone Encounter (Signed)
Dr,Chima I called patient to discuss the below. Patient said that the pharmacy doesn't have  lorazepam solution, but they do have the pill form. Patient said the pharmacist told her she can crush pill and take it.   Pt also mentioned that the Lasmiditan Succinate  50 mg tablet is not at the pharmacy as well. I called [facility] to double check and was informed they do not have this Rx.   Can you send in pill form of lorazepam and resend lasmiditan Succinate 50 mg tablet to the pharmacy?  I have the Lasmiditan Succinate 50 mg pending for you to signed. I will let the Prior Auth team know the PA needed for Emaglity.   Please advise

## 2022-09-08 ENCOUNTER — Telehealth: Payer: Self-pay | Admitting: Pharmacy Technician

## 2022-09-08 NOTE — Telephone Encounter (Signed)
Patient Advocate Encounter   Received notification that prior authorization for Emgality 120MG/ML syringes (migraine) is required.   PA submitted on 09/08/2022 Key [id number] Status is pending       Lyndel Safe, [place] Patient Advocate Specialist [place] Patient Advocate Team Direct Number: [phone]  Fax: [phone]

## 2022-09-08 NOTE — Telephone Encounter (Signed)
Patient Advocate Encounter   Received notification that prior authorization for Emgality 120MG/ML syringes (migraine) is required.   PA submitted on 09/08/2022 Key [id number] Status is pending          Lyndel Safe, [place] Patient Advocate Specialist [place] Patient Advocate Team Direct Number: [phone]  Fax: [phone]

## 2022-09-09 NOTE — Telephone Encounter (Signed)
Patient Advocate Encounter  Prior Authorization for [employer] 120MG/ML syringes (migraine) has been approved.    PA# [id number] Effective dates: 09/08/2022 through 07/25/2023     Lyndel Safe, [place] Patient Advocate Specialist [place] Patient Advocate Team Direct Number: [phone]  Fax: [phone]

## 2022-09-09 NOTE — Telephone Encounter (Signed)
Patient Advocate Encounter  Prior Authorization for [employer] 120MG/ML syringes (migraine) has been approved.    PA# [id number] Effective dates: 09/08/2022 through 07/25/2023     Lyndel Safe, [place] Patient Advocate Specialist [place] Patient Advocate Team Direct Number: [phone]  Fax: [phone]

## 2022-09-30 ENCOUNTER — Ambulatory Visit
Admission: RE | Admit: 2022-09-30 | Discharge: 2022-09-30 | Disposition: A | Discharge status: Home / Self Care | Payer: Medicare Other | Source: Ambulatory Visit | Attending: Psychiatry | Admitting: Psychiatry

## 2022-09-30 DIAGNOSIS — G4452 New daily persistent headache (NDPH): Secondary | ICD-10-CM

## 2022-09-30 MED ORDER — GADOPICLENOL 0.5 MMOL/ML IV SOLN
8.0000 mL | Freq: Once | INTRAVENOUS | Status: AC | PRN
  Administered 2022-09-30: 8 mL via INTRAVENOUS

## 2022-10-03 ENCOUNTER — Encounter: Payer: Self-pay | Admitting: Psychiatry

## 2022-10-03 DIAGNOSIS — G43719 Chronic migraine without aura, intractable, without status migrainosus: Secondary | ICD-10-CM

## 2022-10-04 MED ORDER — EMGALITY 120 MG/ML ~~LOC~~ SOAJ: 1.0000 | SUBCUTANEOUS | 5 refills | Status: DC

## 2022-10-04 NOTE — Telephone Encounter (Signed)
Called [facility] at [phone]. They received rx sent 08/26/22 but it had no refills, even though Dr. Billey Gosling placed 5 refills. She transferred me to pharmacist to provide VO. Spoke w/ Herbie [place]. He placed 5 refills on rx. Nothing further needed.

## 2022-10-05 ENCOUNTER — Telehealth: Payer: Self-pay

## 2022-10-05 NOTE — Telephone Encounter (Signed)
Called pt and let her know her MRI Results. Her MRI Results was MRI did not show any acute abnormalities, mild chronic changes were seen.

## 2022-11-03 ENCOUNTER — Telehealth: Payer: Self-pay | Admitting: *Deleted

## 2022-11-03 NOTE — Telephone Encounter (Signed)
Faxed  completed/signed [facility] form for [employer] to [phone]. Received fax confirmation.

## 2022-12-12 ENCOUNTER — Telehealth: Payer: Self-pay | Admitting: Psychiatry

## 2022-12-12 NOTE — Telephone Encounter (Signed)
LVM and sent mychart msg informing pt of need to reschedule 03/10/23 appt - MD out

## 2023-03-10 ENCOUNTER — Ambulatory Visit: Payer: Medicare Other | Admitting: Psychiatry

## 2023-03-16 ENCOUNTER — Ambulatory Visit: Payer: Medicare Other | Admitting: Physical Therapy

## 2023-03-30 ENCOUNTER — Ambulatory Visit: Payer: Medicare Other | Admitting: Psychiatry

## 2023-03-30 ENCOUNTER — Encounter: Payer: Self-pay | Admitting: Psychiatry

## 2023-03-30 VITALS — BP 112/68 | HR 70 | Ht 63.0 in | Wt 162.5 lb

## 2023-03-30 DIAGNOSIS — G43719 Chronic migraine without aura, intractable, without status migrainosus: Secondary | ICD-10-CM

## 2023-03-30 DIAGNOSIS — R4182 Altered mental status, unspecified: Secondary | ICD-10-CM | POA: Diagnosis not present

## 2023-03-30 DIAGNOSIS — R413 Other amnesia: Secondary | ICD-10-CM

## 2023-03-30 MED ORDER — GABAPENTIN 300 MG PO CAPS: 300.0000 mg | ORAL_CAPSULE | Freq: Three times a day (TID) | ORAL | 11 refills | Status: DC

## 2023-03-30 NOTE — Progress Notes (Signed)
   CC:  headaches  Follow-up Visit  Last visit: 08/26/22  Brief HPI: 57 year old female with a history of CAD, DM, MI s/p stent, HTN, RLS, HLD, renal cell carcinoma, nephrolithiasis who follows in clinic for migraines. MRI brain 09/30/22 was unremarkable.  At her last visit, she was started on Emgality for migraine prevention and Reyvow for rescue.  Interval History: Headaches are about the same as her last visit. Emgality causes a bad migraine when she does the injection. Could not afford Reyvow. Takes Tylenol and baclofen for rescue which takes the edge off. She is currently in the Medicare donut hole and is concerned about medication affordability.  Had an episode where she was riding in the car and suddenly became disoriented. Did not know where she was and took a few minutes to reorient her self. Had another episode where she let the dogs out. Husband saw her staring at the yard and called her name multiple times, but she was unresponsive. Returned to baseline a few minutes later. She does not remember this. Notes increased dizziness and poor sleep as well.   Headache days per month: 23 Headache free days per month: 7  Current Headache Regimen: Preventative: Emgality Abortive: Tylenol, baclofen   Prior Therapies                                  Rescue: Imitrex - contraindicated due to MI Maxalt - syncope, contraindicated due to MI Ubrelvy 100 mg PRN - unable to afford Baclofen 10 mg TID PRN   Prevention: Metoprolol Depakote Topamax Gabapentin Amitriptyline Nortriptyline Effexor Aimovig 70 mg and 140 mg monthly Emgality - lack of efficacy Botox Trigger point injections  Physical Exam:   Vital Signs: BP 112/68 (BP Location: Left Arm, Patient Position: Sitting, Cuff Size: Normal)   Pulse 70   Ht 5\' 3"  (1.6 m)   Wt 162 lb 8 oz (73.7 kg)   BMI 28.79 kg/m  GENERAL:  well appearing, in no acute distress, alert  SKIN:  Color, texture, turgor normal. No rashes or  lesions HEAD:  Normocephalic/atraumatic. RESP: normal respiratory effort  NEUROLOGICAL: Mental Status: Alert, oriented to person, place and time, Follows commands, and Speech fluent and appropriate. Cranial Nerves: PERRL, face symmetric, no dysarthria, hearing grossly intact Motor: moves all extremities equally Gait: normal-based.  IMPRESSION: 57 year old female with a history of CAD, DM, MI s/p stent, HTN, RLS, HLD, renal cell carcinoma, nephrolithiasis who presents for follow up of migraines. She has not noticed any improvement on Emgality. Preventive options are limited as patient is in the Medicare donut hole and is struggling to afford medications. Will stop Emgality and start gabapentin for headache prevention. EEG ordered to rule out seizures as she reports recent episodes of disorientation and unresponsiveness. Will also check B12 and TSH levels today.  PLAN: -B12, TSH -routine EEG -Stop Emgality. Start gabapentin 300 mg TID -Continue baclofen and Tylenol for rescue   Follow-up: 4 months  I spent a total of 30 minutes on the date of the service. Headache education was done. Discussed treatment options including preventive and acute medications. Discussed medication side effects, adverse reactions and drug interactions. Written educational materials and patient instructions outlining all of the above were given.  Ocie Doyne, MD 03/30/23 4:13 PM

## 2023-03-31 LAB — TSH: TSH: 1.95 u[IU]/mL (ref 0.450–4.500)

## 2023-03-31 LAB — VITAMIN B12: Vitamin B-12: 453 pg/mL (ref 232–1245)

## 2023-04-05 ENCOUNTER — Ambulatory Visit: Payer: Medicare Other | Admitting: Neurology

## 2023-04-05 DIAGNOSIS — R4182 Altered mental status, unspecified: Secondary | ICD-10-CM | POA: Diagnosis not present

## 2023-04-06 NOTE — Procedures (Signed)
    History:  57 year old woman with AMS   EEG classification: Awake and drowsy  Duration: 25 minutes   Technical aspects: This EEG study was done with scalp electrodes positioned according to the 10-20 International system of electrode placement. Electrical activity was reviewed with band pass filter of 1-70Hz , sensitivity of 7 uV/mm, display speed of 53mm/sec with a 60Hz  notched filter applied as appropriate. EEG data were recorded continuously and digitally stored.   Description of the recording: The background rhythms of this recording consists of a fairly well modulated medium amplitude alpha rhythm of 10 Hz that is reactive to eye opening and closure. Present in the anterior head region is a 15-20 Hz beta activity. Photic stimulation was performed, did not show any abnormalities. Hyperventilation was also performed, did not show any abnormalities. Drowsiness was manifested by background fragmentation. No abnormal epileptiform discharges seen during this recording. There was no focal slowing. There were no electrographic seizure identified.   Abnormality: None   Impression: This is a normal EEG recorded while drowsy and awake. No evidence of interictal epileptiform discharges. Normal EEGs, however, do not rule out epilepsy.    Windell Norfolk, MD [facility]

## 2023-07-21 ENCOUNTER — Other Ambulatory Visit (HOSPITAL_COMMUNITY): Payer: Self-pay

## 2023-08-17 ENCOUNTER — Telehealth: Payer: Medicare Other | Admitting: Neurology

## 2023-08-17 ENCOUNTER — Telehealth: Payer: Self-pay | Admitting: Neurology

## 2023-08-17 NOTE — Progress Notes (Deleted)
 error

## 2023-08-17 NOTE — Telephone Encounter (Signed)
Pt returning call. Rescheduled and wait listed

## 2023-08-17 NOTE — Telephone Encounter (Signed)
LVM and sent MyChart message informing pt of change from in person to VV- MD sick.

## 2023-10-03 ENCOUNTER — Ambulatory Visit: Payer: Medicare Other | Admitting: Neurology

## 2023-10-03 ENCOUNTER — Encounter: Payer: Self-pay | Admitting: Neurology

## 2023-10-03 VITALS — BP 110/74 | HR 75 | Ht 64.0 in | Wt 163.0 lb

## 2023-10-03 DIAGNOSIS — G43709 Chronic migraine without aura, not intractable, without status migrainosus: Secondary | ICD-10-CM | POA: Diagnosis not present

## 2023-10-03 MED ORDER — GABAPENTIN 100 MG PO CAPS: 100.0000 mg | ORAL_CAPSULE | Freq: Three times a day (TID) | ORAL | 6 refills | Status: DC

## 2023-10-03 NOTE — Patient Instructions (Addendum)
 Decrease Gabapentin to 100mg . Take 1/2 capsule at bedtime for a week. Then increase to 1/2 capsule twice daily for one week. Then increase 1/2 capsule to three times a day and see if this works.Can then increase to 1 whole pill 3 x a day slowly increase.   If you are still groggy just at 50mg  (1/2 cap) at bedtime in a week let us know and we can switch medication.   Speak to your primary care about memory concerns, less likely dementia but more likely chronic pain and multiple medical problems and not sleeping well as well as migraines recommend seeing primary care.   Gabapentin Capsules or Tablets What is this medication? GABAPENTIN (GA ba pen tin) treats nerve pain. It may also be used to prevent and control seizures in people with epilepsy. It works by calming overactive nerves in your body. This medicine may be used for other purposes; ask your health care provider or pharmacist if you have questions. COMMON BRAND NAME(S): Active-PAC with Gabapentin, Ascencion Dike, Gralise, Neurontin What should I tell my care team before I take this medication? They need to know if you have any of these conditions: Kidney disease Lung or breathing disease Substance use disorder Suicidal thoughts, plans, or attempt by you or a family member An unusual or allergic reaction to gabapentin, other medications, foods, dyes, or preservatives Pregnant or trying to get pregnant Breastfeeding How should I use this medication? Take this medication by mouth with a glass of water. Follow the directions on the prescription label. You can take it with or without food. If it upsets your stomach, take it with food. Take your medication at regular intervals. Do not take it more often than directed. Do not stop taking except on your care team's advice. If you are directed to break the 600 or 800 mg tablets in half as part of your dose, the extra half tablet should be used for the next dose. If you have not used the extra half  tablet within 28 days, it should be thrown away. A special MedGuide will be given to you by the pharmacist with each prescription and refill. Be sure to read this information carefully each time. Talk to your care team about the use of this medication in children. While this medication may be prescribed for children as young as 3 years for selected conditions, precautions do apply. Overdosage: If you think you have taken too much of this medicine contact a poison control center or emergency room at once. NOTE: This medicine is only for you. Do not share this medicine with others. What if I miss a dose? If you miss a dose, take it as soon as you can. If it is almost time for your next dose, take only that dose. Do not take double or extra doses. What may interact with this medication? Alcohol Antihistamines for allergy, cough, and cold Certain medications for anxiety or sleep Certain medications for depression like amitriptyline, fluoxetine, sertraline Certain medications for seizures like phenobarbital, primidone Certain medications for stomach problems General anesthetics like halothane, isoflurane, methoxyflurane, propofol Local anesthetics like lidocaine, pramoxine, tetracaine Medications that relax muscles for surgery Opioid medications for pain Phenothiazines like chlorpromazine, mesoridazine, prochlorperazine, thioridazine This list may not describe all possible interactions. Give your health care provider a list of all the medicines, herbs, non-prescription drugs, or dietary supplements you use. Also tell them if you smoke, drink alcohol, or use illegal drugs. Some items may interact with your medicine. What should I watch  for while using this medication? Visit your care team for regular checks on your progress. You may want to keep a record at home of how you feel your condition is responding to treatment. You may want to share this information with your care team at each visit. You  should contact your care team if your seizures get worse or if you have any new types of seizures. Do not stop taking this medication or any of your seizure medications unless instructed by your care team. Stopping your medication suddenly can increase your seizures or their severity. This medication may cause serious skin reactions. They can happen weeks to months after starting the medication. Contact your care team right away if you notice fevers or flu-like symptoms with a rash. The rash may be red or purple and then turn into blisters or peeling of the skin. Or, you might notice a red rash with swelling of the face, lips or lymph nodes in your neck or under your arms. Wear a medical identification bracelet or chain if you are taking this medication for seizures. Carry a card that lists all your medications. This medication may affect your coordination, reaction time, or judgment. Do not drive or operate machinery until you know how this medication affects you. Sit up or stand slowly to reduce the risk of dizzy or fainting spells. Drinking alcohol with this medication can increase the risk of these side effects. Your mouth may get dry. Chewing sugarless gum or sucking hard candy, and drinking plenty of water may help. Watch for new or worsening thoughts of suicide or depression. This includes sudden changes in mood, behaviors, or thoughts. These changes can happen at any time but are more common in the beginning of treatment or after a change in dose. Call your care team right away if you experience these thoughts or worsening depression. If you become pregnant while using this medication, you may enroll in the [place] American Antiepileptic Drug Pregnancy Registry by calling [phone]. This registry collects information about the safety of antiepileptic medication use during pregnancy. What side effects may I notice from receiving this medication? Side effects that you should report to your care team  as soon as possible: Allergic reactions or angioedema--skin rash, itching, hives, swelling of the face, eyes, lips, tongue, arms, or legs, trouble swallowing or breathing Rash, fever, and swollen lymph nodes Thoughts of suicide or self harm, worsening mood, feelings of depression Trouble breathing Unusual changes in mood or behavior in children after use such as difficulty concentrating, hostility, or restlessness Side effects that usually do not require medical attention (report to your care team if they continue or are bothersome): Dizziness Drowsiness Nausea Swelling of ankles, feet, or hands Vomiting This list may not describe all possible side effects. Call your doctor for medical advice about side effects. You may report side effects to FDA at 1-800-FDA-1088. Where should I keep my medication? Keep out of reach of children and pets. Store at room temperature between 15 and 30 degrees C (59 and 86 degrees F). Get rid of any unused medication after the expiration date. This medication may cause accidental overdose and death if taken by other adults, children, or pets. To get rid of medications that are no longer needed or have expired: Take the medication to a medication take-back program. Check with your pharmacy or law enforcement to find a location. If you cannot return the medication, check the label or package insert to see if the medication should be thrown  out in the garbage or flushed down the toilet. If you are not sure, ask your care team. If it is safe to put it in the trash, empty the medication out of the container. Mix the medication with cat litter, dirt, coffee grounds, or other unwanted substance. Seal the mixture in a bag or container. Put it in the trash. NOTE: This sheet is a summary. It may not cover all possible information. If you have questions about this medicine, talk to your doctor, pharmacist, or health care provider.  2024 Elsevier/Gold Standard (2022-04-26  00:00:00)

## 2023-10-03 NOTE — Progress Notes (Unsigned)
 CC:  headaches  Follow-up Visit  10/03/2023: The gabapentin helps but makes groggy. Helps with her neuropathy and with the migraines. Decrease Gabapentin to 100mg . Take at bedtime for a week. Then increase to twice daily for one week. Then increase to three times a day and see if this works. If it still makes her drowsy, we can try Qulipta. She cannot take ER because she cannot swallow pills and needs to crush them up. Next we could try Qulipta(start at 10mg  then increase to 30mg  then 60mg ). She is having memory concerns, recommended seeing primary care less likely dementia at 57 more likely chronic pain. migraines and medical problems. She tried botox she saw Dr. Neale Burly. Aimovig helped. Emgality made it work. Could try Aimovig again or try Ajovy next. Very poor dentition and she cannot afford to go to the dentist.      IMPRESSION: MRI scan of the brain with and without contrast showing only tiny scattered nonspecific T2/FLAIR white matter hyperintensities with differential discussed above  Patient complains of symptoms per HPI as well as the following symptoms: chronic pain . Pertinent negatives and positives per HPI. All others negative    Last visit: 03/2023:   Brief HPI: 58 year old female with a history of CAD, DM, MI s/p stent, HTN, RLS, HLD, renal cell carcinoma, nephrolithiasis who follows in clinic for migraines. MRI brain 09/30/22 was unremarkable.  At her last visit, she was started on Emgality for migraine prevention and Reyvow for rescue.  Interval History: Headaches are about the same as her last visit. Emgality causes a bad migraine when she does the injection. Could not afford Reyvow. Takes Tylenol and baclofen for rescue which takes the edge off. She is currently in the Medicare donut hole and is concerned about medication affordability.  Had an episode where she was riding in the car and suddenly became disoriented. Did not know where she was and took a few minutes to  reorient her self. Had another episode where she let the dogs out. Husband saw her staring at the yard and called her name multiple times, but she was unresponsive. Returned to baseline a few minutes later. She does not remember this. Notes increased dizziness and poor sleep as well.   Headache days per month: 23 Headache free days per month: 7  Current Headache Regimen: Preventative: Emgality Abortive: Tylenol, baclofen   Prior Therapies                                  Rescue: Imitrex - contraindicated due to MI Maxalt - syncope, contraindicated due to MI Ubrelvy 100 mg PRN - unable to afford Baclofen 10 mg TID PRN   Prevention: Metoprolol Depakote Topamax Gabapentin Amitriptyline Nortriptyline Effexor Aimovig 70 mg and 140 mg monthly Emgality - lack of efficacy made her migraines worse.  Botox -  Trigger point injections    Exam: NAD, pleasant                  Speech:    Speech is normal; fluent and spontaneous with normal comprehension. Any dysarthria likely to Very poor dentition Cognition:    The patient is oriented to person, place, and time;     recent and remote memory intact;     language fluent;    Cranial Nerves:    The pupils are equal, round, and reactive to light.Trigeminal sensation is intact and the muscles of mastication  are normal. The face is symmetric. The palate elevates in the midline. Hearing intact. Voice is normal. Shoulder shrug is normal. The tongue has normal motion without fasciculations.   Coordination:  No dysmetria  Motor Observation:    No asymmetry, no atrophy, and no involuntary movements noted. Tone:    Normal muscle tone.     Strength:    Strength is V/V in the upper and lower limbs.      Sensation: intact to LT   IMPRESSION: 58 year old female with a history of CAD, DM, MI s/p stent, HTN, RLS, HLD, renal cell carcinoma, nephrolithiasis who presents for follow up of migraines. She did not notice any improvement on  Emgality. Preventive options are limited as patient is in the Medicare donut hole and is struggling to afford medications. Tried gabapentin and it helped with neuropathy and migraines but 300mg  was too high a dose could not tolerate  Decrease Gabapentin to 100mg . Take 1/2 capsule at bedtime for a week. Then increase to 1/2 capsule twice daily for one week. Then increase 1/2 capsule to three times a day and see if this works.Can then increase to 1 whole pill 3 x a day slowly increase.   If you are still groggy just at 50mg  (1/2 cap) at bedtime in a week let us know and we can switch medication. She cannot take ER because she cannot swallow pills and needs to crush them up.  Speak to your primary care about memory concerns, less likely dementia but more likely chronic pain and multiple medical problems and not sleeping well as well as migraines recommend seeing primary care.    If it still makes her drowsy, we can try [place] if insurance will pay, she has a difficult time.  Next we could try Qulipta(start at 10mg  then increase to 30mg  then 60mg ).  She tried botox she saw Dr. Neale Burly. Aimovig helped. Emgality made it worse. Could try Aimovig again or try Ajovy next.     I spent over 30 minutes of face-to-face and non-face-to-face time with patient on the  1. Chronic migraine without aura without status migrainosus, not intractable    diagnosis.  This included previsit chart review, lab review, study review, order entry, electronic health record documentation, patient education on the different diagnostic and therapeutic options, counseling and coordination of care, risks and benefits of management, compliance, or risk factor reduction

## 2024-03-28 ENCOUNTER — Encounter: Payer: Self-pay | Admitting: Neurology

## 2024-04-11 ENCOUNTER — Telehealth: Admitting: Neurology

## 2024-04-19 ENCOUNTER — Encounter: Payer: Self-pay | Admitting: Neurology

## 2024-04-19 DIAGNOSIS — G43709 Chronic migraine without aura, not intractable, without status migrainosus: Secondary | ICD-10-CM

## 2024-04-23 NOTE — Telephone Encounter (Signed)
 See other encounter. I got patient scheduled with an NP.

## 2024-04-24 ENCOUNTER — Ambulatory Visit: Admitting: Adult Health

## 2024-04-24 ENCOUNTER — Encounter: Payer: Self-pay | Admitting: Adult Health

## 2024-04-24 VITALS — BP 128/74 | HR 84 | Ht 64.0 in | Wt 172.8 lb

## 2024-04-24 DIAGNOSIS — G43E11 Chronic migraine with aura, intractable, with status migrainosus: Secondary | ICD-10-CM | POA: Diagnosis not present

## 2024-04-24 DIAGNOSIS — F419 Anxiety disorder, unspecified: Secondary | ICD-10-CM

## 2024-04-24 DIAGNOSIS — G629 Polyneuropathy, unspecified: Secondary | ICD-10-CM | POA: Diagnosis not present

## 2024-04-24 MED ORDER — AJOVY 225 MG/1.5ML ~~LOC~~ SOAJ: 225.0000 mg | SUBCUTANEOUS | 11 refills | Status: AC

## 2024-04-24 MED ORDER — DULOXETINE HCL 20 MG PO CPEP: 20.0000 mg | ORAL_CAPSULE | Freq: Every day | ORAL | 11 refills | Status: AC

## 2024-04-24 NOTE — Patient Instructions (Addendum)
 Your Plan:  Start Ajovy monthly injection - can look into [employer] for patient financial assistance   If no benefit or unable to tolerate, please let me know  Start Cymbalta 30mg  daily for neuropathy and migraines  Please stop gabapentin  at this time due to possible side effects and lack of benefit  Please follow up with your PCP regarding increased anxiety - can discuss trial of medications such as buspirone or hydroxyzine to use as needed     Follow up in 6 months or call earlier if needed       Thank you for coming to see us  at [facility]. I hope we have been able to provide you high quality care today.  You may receive a patient satisfaction survey over the next few weeks. We would appreciate your feedback and comments so that we may continue to improve ourselves and the health of our patients.

## 2024-04-24 NOTE — Progress Notes (Signed)
 [facility] [address] [place]. [place] [address] [phone]       OFFICE FOLLOW UP NOTE  Ms. Takita Riecke Date of Birth:  [birth year] Medical Record Number:  [id number]     Reason for visit: Chronic migraine    SUBJECTIVE:  CHIEF COMPLAINT:  Chief Complaint  Patient presents with   RM 8     Patient is here to discuss worsening migraine symptoms - tried botox and steroid injections, Emgality , and gabapentin  did not help. With the gabapentin  has been having issues with her breathing would like to know if this is the cause of the dizziness, SOB, loss of vision and shaking,     Follow-up visit:  Prior visit: 10/03/2023 with Dr. Ines   Brief HPI:   Rosmary Dionisio is a 58 y.o. female who is followed for chronic migraine headaches.  PMH of CAD, DM, MI s/p stent, HTN, RLS, HLD, renal cell carcinoma and nephrolithiasis.  MRI brain 09/2022 unremarkable.  At prior visit with Dr. Ines, noted no improvement on Emgality , noted gabapentin  300mg  TID help with neuropathy and migraines but difficulty tolerating dose therefore decreased dosage.  Noted trial of Qulipta if migraines worsen.    Interval history:  Patient returns for follow-up visit to discuss other treatment options for migraine headaches as she no longer felt gabapentin  was helping. Reports gabapentin  initially working well for the first 1 to 2 months but then migraines gradually started to return. Reports this has happened with multiple other migraine treatments, seems like benefit only lasts a short duration. She is also concerned that gabapentin  has been causing side effects such as dizziness, shortness of breath, vision fluctuation and occasional tremor.  She self discontinued gabapentin  yesterday, she was taking 100mg  twice daily. She has been seeing a pulmonologist for management of her asthma with trial of different inhalers and recurrent thrush.  She has also been experiencing  significantly increased anxiety due to financial strain, chronic low back and hip pain and frequent headaches.  She reports headaches occur every other day if not daily, can be left or right sided primarily behind her ear but at times can also be tight band sensation and at times icepick sensation at the top of her head that last for about 20 to 30 seconds.  She can occasionally have visual auras with her migraine and recently experienced rebound aura, she can also have dizziness.  Migraines associated with photophobia, phonophobia and nausea.  She has been using liquid Tylenol and baclofen as abortive but also for chronic low back and hip pain.  She does have underlying neuropathic pain for which she felt gabapentin  was beneficial and is concerned the symptoms will worsen now that she stopped gabapentin .     Prior Therapies                                  Rescue: Imitrex - contraindicated due to MI Maxalt - syncope, contraindicated due to MI Ubrelvy 100 mg PRN - unable to afford Baclofen 10 mg TID PRN   Prevention: Metoprolol Depakote Topamax Gabapentin  - side effects Amitriptyline Nortriptyline Effexor Aimovig 70 mg and 140 mg monthly Emgality  - lack of efficacy made her migraines worse.  Botox  Trigger point injections     ROS:   14 system review of systems performed and negative with exception of those listed in HPI  PMH:  Past Medical History:  Diagnosis Date  Asthma    Headache(784.0)    Migraines     PSH:  Past Surgical History:  Procedure Laterality Date   CESAREAN SECTION     ESOPHAGEAL DILATION     HERNIA REPAIR     SPINE SURGERY     TUBAL LIGATION      Social History:  Social History   Socioeconomic History   Marital status: Married    Spouse name: Not on file   Number of children: Not on file   Years of education: Not on file   Highest education level: Not on file  Occupational History   Not on file  Tobacco Use   Smoking status: Never    Smokeless tobacco: Never  Substance and Sexual Activity   Alcohol use: No   Drug use: No   Sexual activity: Not on file  Other Topics Concern   Not on file  Social History Narrative   No caffeine - decaf tea and coffee and sometimes ginger ale    Social Drivers of Health   Financial Resource Strain: Medium Risk (04/12/2022)   Received from [facility]   Overall Financial Resource Strain (CARDIA)    Difficulty of Paying Living Expenses: Somewhat hard  Food Insecurity: Low Risk  (02/14/2024)   Received from [facility]   Hunger Vital Sign    Within the past 12 months, you worried that your food would run out before you got money to buy more: Never true    Within the past 12 months, the food you bought just didn't last and you didn't have money to get more. : Never true  Transportation Needs: No Transportation Needs (02/14/2024)   Received from [facility]    In the past 12 months, has lack of reliable transportation kept you from medical appointments, meetings, work or from getting things needed for daily living? : No  Physical Activity: Sufficiently Active (04/12/2022)   Received from [facility]   Exercise Vital Sign    On average, how many days per week do you engage in moderate to strenuous exercise (like a brisk walk)?: 4 days    On average, how many minutes do you engage in exercise at this level?: 50 min  Stress: Stress Concern Present (04/12/2022)   Received from [facility] - Occupational Stress Questionnaire    Feeling of Stress : To some extent  Social Connections: Unknown (04/14/2023)   Received from [facility]   Social Network    Social Network: Not on file  Intimate Partner Violence: Unknown (04/14/2023)   Received from [facility]   HITS    Physically Hurt: Not on file    Insult or Talk Down To: Not on file    Threaten Physical Harm: Not on file    Scream or Curse: Not on file    Family  History:  Family History  Problem Relation Age of Onset   Diabetes Mother    Heart disease Mother    Hyperlipidemia Mother    Hypertension Mother    Diabetes Father    Cancer Father    Stroke Father    Hypertension Father     Medications:   Current Outpatient Medications on File Prior to Visit  Medication Sig Dispense Refill   acetaminophen (TYLENOL) 160 MG/5ML suspension Take 960 mg by mouth every 6 (six) hours as needed (for migraines).     albuterol  (PROVENTIL  HFA;VENTOLIN  HFA) 108 (90 Base) MCG/ACT inhaler Inhale  2 puffs into the lungs every 6 (six) hours as needed for wheezing or shortness of breath.     aspirin EC 81 MG tablet Take by mouth.     baclofen (LIORESAL) 10 MG tablet Take 10 mg by mouth daily as needed.     calcium carbonate (OS-CAL) 1250 (500 Ca) MG chewable tablet Chew by mouth.     calcium carbonate (TUMS - DOSED IN MG ELEMENTAL CALCIUM) 500 MG chewable tablet Chew 1-2 tablets by mouth as needed for indigestion or heartburn.      Dupilumab 300 MG/2ML SOPN Inject into the skin.     ezetimibe (ZETIA) 10 MG tablet Take 10 mg by mouth daily.     fenofibrate 160 MG tablet Take 160 mg by mouth daily.     fluconazole (DIFLUCAN) 100 MG tablet Take 100 mg by mouth daily.     gabapentin  (NEURONTIN ) 100 MG capsule Take 1 capsule (100 mg total) by mouth 3 (three) times daily. (Patient taking differently: Take 100 mg by mouth 3 (three) times daily. Takes twice a day) 90 capsule 6   ipratropium (ATROVENT HFA) 17 MCG/ACT inhaler Inhale 2 puffs into the lungs.     methylPREDNISolone (MEDROL DOSEPAK) 4 MG TBPK tablet See admin instructions. see package     metoprolol succinate (TOPROL-XL) 25 MG 24 hr tablet Take 25 mg by mouth daily.     nitroGLYCERIN (NITROSTAT) 0.4 MG SL tablet Place under the tongue.     pantoprazole (PROTONIX) 40 MG tablet Take 1 tablet by mouth daily.     rosuvastatin (CRESTOR) 5 MG tablet Take 5 mg by mouth daily.     SYMBICORT 160-4.5 MCG/ACT inhaler Inhale  2 puffs into the lungs 2 (two) times daily.     triamcinolone cream (KENALOG) 0.1 % Apply 1 Application topically 2 (two) times daily.     VYJUVEK [phone] PFU/2.5ML GEL      No current facility-administered medications on file prior to visit.    Allergies:   Allergies  Allergen Reactions   Mangifera Indica Anaphylaxis and Swelling    MANGO: Cannot even smell this   Topiramate Other (See Comments) and Palpitations    I cannot function   Other Other (See Comments)    CANNOT SWALLOW PILLS- STATED SHE WILL CHOKE   Prednisone  Diarrhea and Other (See Comments)    Not able to tolerate due to IBS (Cannot take oral form, but can take via IV) Causes SEVERE STOMACH PAIN, too   Red Dye #40 (Allura Red) Itching   Aspirin Other (See Comments)    GI Upset- stomach burns   Clarithromycin Itching and Rash   Latex Rash    I must have paper tape   Penicillins Itching and Rash    Did it involve swelling of the face/tongue/throat, SOB, or low BP? Yes Did it involve sudden or severe rash/hives, skin peeling, or any reaction on the inside of your mouth or nose? No Did you need to seek medical attention at a hospital or doctor's office? No When did it last happen? Over 10 years ago  If all above answers are NO, may proceed with cephalosporin use.    Tape Rash    No latex in [place] paper tape only   Tramadol Itching      OBJECTIVE:  Physical Exam  Vitals:   04/24/24 1516  Weight: 172 lb 12.8 oz (78.4 kg)  Height: 5' 4 (1.626 m)   Body mass index is 29.66 kg/m. No results found.  General: well developed, well nourished, very pleasant middle-age Caucasian female, seated, mildly anxious  Neurologic Exam Mental Status: Awake and fully alert.  Mild dysarthria but suspect more due to very poor dentition.  Oriented to place and time. Recent and remote memory intact. Attention span, concentration and fund of knowledge appropriate. Mood and affect mildly anxious although  pleasant.  Cranial Nerves: Extraocular movements full without nystagmus. Visual fields full to confrontation. Hearing intact. Facial sensation intact. Face, tongue, palate moves normally and symmetrically.  Motor: Normal bulk and tone. Normal strength in all tested extremity muscles Gait and Station: Arises from chair without difficulty. Stance is normal. Gait demonstrates normal stride length and balance without use of AD.          ASSESSMENT/PLAN: Arianne Klinge is a 58 y.o. year old female      Chronic migraine headaches:  Gradual worsening of migraines over the past several months, now every other day if not daily Agree with discontinuing gabapentin  as no benefit and concern of side effects - low suspicion gabapentin  is causing shortness of breath and suspect more due to pulmonary reasons and advised to continue to follow closely with pulmonology.  She is also suffering from significant anxiety and advised to follow-up with PCP for further treatment options. Recommend initiating Ajovy monthly injection, sample provided today to get started. She is concerned of price due to financial strain. If needed, can look at applying for [employer] financial assistance. If this is needed, will request fee be waived due to financial strain Start duloxetine 20 mg daily for further headache prevention while waiting for Ajovy to take effect. Advised this may also further help her neuropathy and anxiety.  As far as rescue, continue liquid tylenol as needed but advised to limit use due to suspected component of rebound headache. Triptan's contraindicated due to hx of MI and CAD. Discussed trial of Nurtec but she is concerned about the price - she will call if interested in pursuing in the future She will need to establish care with new provider as prior patient of Dr. Ines but she is also considering establishing care with new headache center. Will hold off for now on establishing with new MD here  unless new symptoms should arise or having difficulty getting migraines under control.       Follow up in 6 months or call earlier if needed   CC:  PCP: Karen Glendia Smalls, PA-C    I personally spent a total of 50 minutes in the care of the patient today including preparing to see the patient, getting/reviewing separately obtained history, performing a medically appropriate exam/evaluation, counseling and educating, placing orders, and documenting clinical information in the EHR. This is our first time meeting and time has been spent reviewing past medical history and relevant medical records.   Harlene Bogaert, AGNP-BC  [facility] [address] [place], [place] [address]  Phone [phone] Fax [phone] Note: This document was prepared with digital dictation and possible smart phrase technology. Any transcriptional errors that result from this process are unintentional.

## 2024-04-30 ENCOUNTER — Other Ambulatory Visit (HOSPITAL_COMMUNITY): Payer: Self-pay

## 2024-04-30 ENCOUNTER — Telehealth: Payer: Self-pay | Admitting: *Deleted

## 2024-04-30 MED ORDER — QULIPTA 30 MG PO TABS: 30.0000 mg | ORAL_TABLET | Freq: Every day | ORAL | 11 refills | Status: DC

## 2024-04-30 NOTE — Telephone Encounter (Signed)
 MyChart message sent to patient.  She has previously tried/failed Aimovig and Emgality .  Offered initiating Qulipta.  Order will be placed if patient agrees.

## 2024-04-30 NOTE — Telephone Encounter (Signed)
 Pharmacy Patient Advocate Encounter   Received notification from Physician's Office that prior authorization for Ajovy is required/requested.   Insurance verification completed.   The patient is insured through [facility].   Per test claim:  Aimovig, Emgality , Qulipta, Nurtec is preferred by the insurance.  If suggested medication is appropriate, Please send in a new RX and discontinue this one. If not, please advise as to why it's not appropriate so that we may request a Prior Authorization. Please note, some preferred medications may still require a PA.  If the suggested medications have not been trialed and there are no contraindications to their use, the PA will not be submitted, as it will not be approved.

## 2024-04-30 NOTE — Telephone Encounter (Signed)
 Ajovy PA needed per pt report.

## 2024-05-01 ENCOUNTER — Telehealth: Payer: Self-pay | Admitting: Pharmacist

## 2024-05-01 NOTE — Telephone Encounter (Signed)
 Pharmacy Patient Advocate Encounter   Received notification from Patient Pharmacy that prior authorization for Qulipta 30MG  tablets is required/requested.   Insurance verification completed.   The patient is insured through [facility].   Per test claim: PA required; PA submitted to above mentioned insurance via Latent Key/confirmation #/EOC [id number] Status is pending

## 2024-05-01 NOTE — Telephone Encounter (Signed)
 Pharmacy Patient Advocate Encounter  Received notification from [facility] that Prior Authorization for QULIPTA 30 MG PO TABS has been APPROVED from 05/01/2024 to 07/24/2025   PA #/Case ID/Reference #: [id number]

## 2024-06-17 ENCOUNTER — Encounter: Payer: Self-pay | Admitting: Adult Health

## 2024-06-17 DIAGNOSIS — G43E11 Chronic migraine with aura, intractable, with status migrainosus: Secondary | ICD-10-CM

## 2024-06-18 MED ORDER — NURTEC 75 MG PO TBDP: 75.0000 mg | ORAL_TABLET | ORAL | 11 refills | Status: AC

## 2024-07-01 ENCOUNTER — Telehealth: Payer: Self-pay | Admitting: Adult Health

## 2024-07-01 NOTE — Telephone Encounter (Signed)
 Patient said pharmacy informed need a PA for Nurtec and insurance will not approve until get PA. Patient only have one dose left for tomorrow,.

## 2024-07-04 ENCOUNTER — Other Ambulatory Visit (HOSPITAL_COMMUNITY): Payer: Self-pay

## 2024-07-04 NOTE — Telephone Encounter (Signed)
 I will have to outreach the plan-I submitted for the new QTY and DS and they are saying PA needed due to QTY limit.

## 2024-07-04 NOTE — Telephone Encounter (Signed)
 Pharmacy Patient Advocate Encounter   Received notification from Physician's Office that prior authorization for Nurtec is required/requested.   Insurance verification completed.   The patient is insured through [facility].   Per test claim: PA required; PA submitted to above mentioned insurance via Latent Key/confirmation #/EOC [facility] Status is pending

## 2024-07-09 ENCOUNTER — Other Ambulatory Visit (HOSPITAL_COMMUNITY): Payer: Self-pay

## 2024-07-09 NOTE — Telephone Encounter (Signed)
° °  I ran for 16 tab for 30 day supply, plan will not pay for anything outside of 30 day supply. Covered under current PA-this expires 07/24/2024 and I will resubmit PA at that time for preventive.

## 2024-07-19 ENCOUNTER — Encounter: Payer: Self-pay | Admitting: Adult Health

## 2024-11-18 ENCOUNTER — Ambulatory Visit: Admitting: Adult Health
